# Patient Record
Sex: Female | Born: 1975 | Race: Black or African American | Hispanic: No | Marital: Single | Smoking: Former smoker
Health system: Southern US, Community
[De-identification: ages and names within clinical notes are randomized; demographics above are authoritative.]

## PROBLEM LIST (undated history)

## (undated) DIAGNOSIS — E559 Vitamin D deficiency, unspecified: Secondary | ICD-10-CM

## (undated) DIAGNOSIS — T783XXA Angioneurotic edema, initial encounter: Secondary | ICD-10-CM

## (undated) DIAGNOSIS — K219 Gastro-esophageal reflux disease without esophagitis: Secondary | ICD-10-CM

## (undated) DIAGNOSIS — D649 Anemia, unspecified: Secondary | ICD-10-CM

## (undated) DIAGNOSIS — D219 Benign neoplasm of connective and other soft tissue, unspecified: Secondary | ICD-10-CM

## (undated) DIAGNOSIS — L509 Urticaria, unspecified: Secondary | ICD-10-CM

## (undated) DIAGNOSIS — F419 Anxiety disorder, unspecified: Secondary | ICD-10-CM

## (undated) DIAGNOSIS — F1910 Other psychoactive substance abuse, uncomplicated: Secondary | ICD-10-CM

## (undated) DIAGNOSIS — T7840XA Allergy, unspecified, initial encounter: Secondary | ICD-10-CM

## (undated) HISTORY — DX: Anxiety disorder, unspecified: F41.9

## (undated) HISTORY — DX: Vitamin D deficiency, unspecified: E55.9

## (undated) HISTORY — DX: Allergy, unspecified, initial encounter: T78.40XA

## (undated) HISTORY — DX: Other psychoactive substance abuse, uncomplicated: F19.10

## (undated) HISTORY — DX: Urticaria, unspecified: L50.9

## (undated) HISTORY — DX: Gastro-esophageal reflux disease without esophagitis: K21.9

## (undated) HISTORY — DX: Anemia, unspecified: D64.9

## (undated) HISTORY — DX: Angioneurotic edema, initial encounter: T78.3XXA

## (undated) HISTORY — DX: Benign neoplasm of connective and other soft tissue, unspecified: D21.9

---

## 2009-04-24 ENCOUNTER — Encounter: Payer: Self-pay | Admitting: Family Medicine

## 2009-04-24 ENCOUNTER — Other Ambulatory Visit: Admission: RE | Admit: 2009-04-24 | Discharge: 2009-04-24 | Payer: Self-pay | Admitting: Family Medicine

## 2009-04-24 ENCOUNTER — Ambulatory Visit: Payer: Self-pay | Admitting: Family Medicine

## 2009-04-27 ENCOUNTER — Encounter: Payer: Self-pay | Admitting: Family Medicine

## 2009-04-28 LAB — CONVERTED CEMR LAB
BUN: 10 mg/dL (ref 6–23)
CO2: 22 meq/L (ref 19–32)
Calcium: 8.7 mg/dL (ref 8.4–10.5)
Chloride: 106 meq/L (ref 96–112)
Cholesterol: 122 mg/dL (ref 0–200)
Creatinine, Ser: 0.7 mg/dL (ref 0.40–1.20)
HDL: 62 mg/dL (ref >39)
Total CHOL/HDL Ratio: 2
Triglycerides: 25 mg/dL (ref <150)

## 2009-04-30 LAB — CONVERTED CEMR LAB: Pap Smear: NORMAL

## 2009-06-25 ENCOUNTER — Ambulatory Visit: Payer: Self-pay | Admitting: Family Medicine

## 2009-06-25 DIAGNOSIS — N946 Dysmenorrhea, unspecified: Secondary | ICD-10-CM | POA: Insufficient documentation

## 2009-06-25 DIAGNOSIS — L219 Seborrheic dermatitis, unspecified: Secondary | ICD-10-CM

## 2010-06-14 ENCOUNTER — Ambulatory Visit: Payer: Self-pay | Admitting: Family Medicine

## 2010-06-14 ENCOUNTER — Other Ambulatory Visit: Admission: RE | Admit: 2010-06-14 | Discharge: 2010-06-14 | Payer: Self-pay | Admitting: Family Medicine

## 2010-06-15 LAB — CONVERTED CEMR LAB
BUN: 11 mg/dL (ref 6–23)
CO2: 25 meq/L (ref 19–32)
Creatinine, Ser: 0.69 mg/dL (ref 0.40–1.20)
Glucose, Bld: 96 mg/dL (ref 70–99)
Total Bilirubin: 0.2 mg/dL — ABNORMAL LOW (ref 0.3–1.2)
Total Protein: 6.7 g/dL (ref 6.0–8.3)

## 2010-06-16 ENCOUNTER — Encounter: Payer: Self-pay | Admitting: Family Medicine

## 2010-06-16 LAB — CONVERTED CEMR LAB
Trich, Wet Prep: NONE SEEN
Yeast Wet Prep HPF POC: NONE SEEN

## 2010-06-21 ENCOUNTER — Encounter: Admission: RE | Admit: 2010-06-21 | Discharge: 2010-06-21 | Payer: Self-pay | Admitting: Family Medicine

## 2010-06-21 LAB — CONVERTED CEMR LAB: Pap Smear: NEGATIVE

## 2010-06-23 ENCOUNTER — Ambulatory Visit: Payer: Self-pay | Admitting: Family Medicine

## 2010-06-26 ENCOUNTER — Encounter: Admission: RE | Admit: 2010-06-26 | Discharge: 2010-06-26 | Payer: Self-pay | Admitting: Family Medicine

## 2010-06-27 DIAGNOSIS — D259 Leiomyoma of uterus, unspecified: Secondary | ICD-10-CM | POA: Insufficient documentation

## 2010-07-13 ENCOUNTER — Ambulatory Visit: Payer: Self-pay | Admitting: Obstetrics & Gynecology

## 2010-07-14 ENCOUNTER — Encounter: Payer: Self-pay | Admitting: Obstetrics & Gynecology

## 2010-07-14 LAB — CONVERTED CEMR LAB
Hepatitis B Surface Ag: NEGATIVE
MCV: 96 fL (ref 78.0–100.0)
RBC: 3.77 M/uL — ABNORMAL LOW (ref 3.87–5.11)
WBC: 7.2 10*3/uL (ref 4.0–10.5)

## 2010-07-20 ENCOUNTER — Telehealth: Payer: Self-pay | Admitting: Family Medicine

## 2010-10-15 ENCOUNTER — Ambulatory Visit: Admit: 2010-10-15 | Payer: Self-pay | Admitting: Obstetrics & Gynecology

## 2010-10-17 ENCOUNTER — Encounter: Payer: Self-pay | Admitting: Family Medicine

## 2010-10-20 ENCOUNTER — Ambulatory Visit
Admission: RE | Admit: 2010-10-20 | Discharge: 2010-10-20 | Payer: Self-pay | Source: Home / Self Care | Attending: Obstetrics & Gynecology | Admitting: Obstetrics & Gynecology

## 2010-10-21 NOTE — Assessment & Plan Note (Unsigned)
NAME:  Kayla Munoz, CAVENAUGH NO.:  [id number]  MEDICAL RECORD NO.:  [id number]          PATIENT TYPE:  POB  LOCATION:  [facility] [place]         FACILITY:  [facility]  PHYSICIAN:  Elsie Lincoln, MD      DATE OF BIRTH:  [birth year]  DATE OF SERVICE:  10/20/2010                                 CLINIC NOTE  HISTORY OF PRESENT ILLNESS:  The patient is a 35 year old female who presents for followup of her fibroids and bleeding.  The patient is a lovely nulliparous female who has very large fibroids.  Her uterus is approximately 18 weeks' size.  She has multiple fibroids ranging in various sizes with one that has a large submucosal component.  The patient tried Depo-Provera and has had reasonable response after one shot.  She has gone several days and missed a week without bleeding, but she does have some spotting a lot of days, although I do not consider this a good result.  She is happy given that she is to bleed a lot.  She just entered a relationship and may be sexually active soon, and obviously daily bleeding will not be satisfactory.  We talked about the Depo Lupron with myomectomy being an option.  We also talked about fertility.  The patient is interested in speaking with a Reproductive Endocrinologist about myomectomy and future fertility.  I suggested she speak with Dr. April Manson of [facility] to answer any further questions that I am unable to.  She will make an appointment with him.  The patient would like to try one more shot at Depo-Provera and see if her bleeding is even more controlled than it is.  If it is not any better, then we will most likely proceed with Depo Lupron to shrink the fibroids and then myomectomy unless Dr. April Manson can have other therapies that would benefit the patient more.  The patient's STD panel was negative last visit.  The patient again was encouraged to quit smoking.  PHYSICAL EXAMINATION:  VITAL SIGNS:  Pulse 71, blood  pressure 107/68, weight 145, height 65 inches. GENERAL:  Well-nourished, well-developed, in no apparent distress. HEENT:  Normocephalic and atraumatic. CHEST:  Normal movement. ABDOMEN:  Soft and nontender.  No rebound. GU:  Uterus is felt on the abdominal exam. GENITALIA:  Tanner V.  Vagina pink, normal rugae, small amount of old blood brown blood.  Cervix closed.  Uterus 18 weeks size, nontender. EXTREMITIES:  No edema.  ASSESSMENT/PLAN:  A 35 year old female with large fibroids in the submucosal component. 1. Depo-Provera. 2. Referral to Reproductive Endocrinology.  Preconceptual consult. 3. Return to clinic in 12 weeks for another Depo or different     management.          ______________________________ Elsie Lincoln, MD   KL/MEDQ  D:  10/20/2010  T:  10/21/2010  Job:  [id number]

## 2010-10-26 NOTE — Progress Notes (Signed)
Summary: Gas and bloating  Phone Note Call from Patient Call back at Home Phone [phone]   Caller: Patient Call For: Nani Gasser MD Summary of Call: Having alot of gas and bloating. Told to take Align- pro-biotic but has not started it. Feels like air bubble in abdomen and is painful on left flank area. No vomiting- some nausea- when eats makes it worse. Initial call taken by: Kathlene November LPN,  July 20, 2010 8:43 AM  Follow-up for Phone Call        Start teh align. Will take a week to notice improvement in sxs. Can also start prilosec OTC once a day 20 min before first meal of day. If not better by monday then call the office.  Follow-up by: Nani Gasser MD,  July 20, 2010 9:04 AM  Additional Follow-up for Phone Call Additional follow up Details #1::        Pt notified of above instructions. KJ LPN Additional Follow-up by: Kathlene November LPN,  July 20, 2010 9:11 AM

## 2010-10-26 NOTE — Assessment & Plan Note (Signed)
Summary: CPE w/pap   Vital Signs:  Patient profile:   35 year old female Height:      63 inches Weight:      146 pounds BMI:     25.96 Pulse rate:   111 / minute BP sitting:   121 / 75  (right arm) Cuff size:   regular  Vitals Entered By: Avon Gully CMA, Duncan Dull) (June 14, 2010 3:12 PM) CC: CPE and pap, concerned because periods seem to be heavier, bloating feeling in thelower abd,concerned about fibroids   Primary Care Provider:  Nani Gasser MD  CC:  CPE and pap, concerned because periods seem to be heavier, bloating feeling in thelower abd, and concerned about fibroids.  History of Present Illness: CPE and pap, concerned because periods seem to be heavier, bloating feeling in thelower abd,concerned about fibroids. Has a pulling sensation in her lower abdomen during her last period. Felt sharp and didn't last long.  Having severe gas pain for the last week.  Intermittant constipation or diarrhea.  Occ nausea as well. Has noticed a slight vaginal discharge. No odor. D/c is clear.   Current Medications (verified): 1)  Vitamin D 1000 Unit Tabs (Cholecalciferol) .... Take One Tablet By Mouth Once A Day 2)  Iron 325 (65 Fe) Mg Tabs (Ferrous Sulfate) .... Take One Tablt By Mouth Once A Day 3)  Multivitamins  Tabs (Multiple Vitamin) .... Take One Tablet By Mouth Once A Day 4)  Ibuprofen 800 Mg Tabs (Ibuprofen) .... Take 1 Tablet By Mouth Three Times A Day As Needed With Food. 5)  Triamcinolone Acetonide 0.025 % Crea (Triamcinolone Acetonide) .... Apply Once A Day As Needed  Allergies (verified): No Known Drug Allergies  Comments:  Nurse/Medical Assistant: The patient's medications and allergies were reviewed with the patient and were updated in the Medication and Allergy Lists. Avon Gully CMA, Duncan Dull) (June 14, 2010 3:14 PM)  Past History:  Past Medical History: Last updated: 04/24/2009 None  Past Surgical History: Last updated:  04/24/2009 None  Family History: Last updated: 04/24/2009 GF prostate Ca Uncle with stomach ca mother, Father with DM Father with hi chol, HTN MOther wtih pulmonary heart problem.    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  Lives alone.  Smokes 1/2 ppd for 11 years.  Drinks alsochol about 3 per week.  Drug use-no Regular exercise-yes, cardio & weights.   Social History: Reviewed history from 04/24/2009 and no changes required. 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  Lives alone.  Smokes 1/2 ppd for 11 years.  Drinks alsochol about 3 per week.  Drug use-no Regular exercise-yes, cardio & weights.   Review of Systems       The patient complains of abdominal pain.  The patient denies anorexia, fever, weight loss, weight gain, vision loss, decreased hearing, hoarseness, chest pain, syncope, dyspnea on exertion, peripheral edema, prolonged cough, headaches, hemoptysis, melena, hematochezia, severe indigestion/heartburn, hematuria, incontinence, genital sores, muscle weakness, suspicious skin lesions, transient blindness, difficulty walking, depression, unusual weight change, abnormal bleeding, enlarged lymph nodes, and breast masses.    Physical Exam  General:  Well-developed,well-nourished,in no acute distress; alert,appropriate and cooperative throughout examination Head:  Normocephalic and atraumatic without obvious abnormalities. No apparent alopecia or balding. Eyes:  No corneal or conjunctival inflammation noted. EOMI. Perrla.  Ears:  External ear exam shows no significant lesions or deformities.  Otoscopic examination reveals clear canals, tympanic membranes  are intact bilaterally without bulging, retraction, inflammation or discharge. Hearing is grossly normal bilaterally. Nose:  External nasal examination shows no deformity or inflammation. Nasal mucosa are pink and moist without lesions or  exudates. Mouth:  Oral mucosa and oropharynx without lesions or exudates.  Teeth in good repair. Neck:  No deformities, masses, or tenderness noted. Lungs:  Normal respiratory effort, chest expands symmetrically. Lungs are clear to auscultation, no crackles or wheezes. Heart:  Normal rate and regular rhythm. S1 and S2 normal without gallop, murmur, click, rub or other extra sounds. Abdomen:  Bowel sounds positive,abdomen soft and non-tender without masses, organomegaly or hernias noted. Genitalia:  Normal introitus for age, no external lesions, no vaginal discharge, mucosa pink and moist, no vaginal or cervical lesions, no vaginal atrophy, no friaility or hemorrhage, normal uterus size and position, no adnexal masses or tenderness Msk:  No deformity or scoliosis noted of thoracic or lumbar spine.   Pulses:  R and L carotid,radial,dorsalis pedis and posterior tibial pulses are full and equal bilaterally Extremities:  No clubbing, cyanosis, edema, or deformity noted with normal full range of motion of all joints.   Neurologic:  No cranial nerve deficits noted. Station and gait are normal. t. Sensory, motor and coordinative functions appear intact. Skin:  no rashes.   Cervical Nodes:  No lymphadenopathy noted Psych:  Cognition and judgment appear intact. Alert and cooperative with normal attention span and concentration. No apparent delusions, illusions, hallucinations   Impression & Recommendations:  Problem # 1:  ROUTINE GYNECOLOGICAL EXAMINATION (ICD-V72.31) Exam is normal today Due for CMP. Lipids last year were at goal.  She has been working on cutting back to 1/2ppd but she is not ready to completley quit.    Orders: T-Comprehensive Metabolic Panel ([id number])  Problem # 2:  VAGINITIS (ICD-616.10) Likely yeast based onexam. will send wet prep.   Orders: T-Wet Prep for Christoper Allegra, [employer] [phone])  Complete Medication List: 1)  Vitamin D 1000 Unit Tabs (Cholecalciferol)  .... Take one tablet by mouth once a day 2)  Iron 325 (65 Fe) Mg Tabs (Ferrous sulfate) .... Take one tablt by mouth once a day 3)  Multivitamins Tabs (Multiple vitamin) .... Take one tablet by mouth once a day 4)  Ibuprofen 800 Mg Tabs (Ibuprofen) .... Take 1 tablet by mouth three times a day as needed with food. 5)  Triamcinolone Acetonide 0.025 % Crea (Triamcinolone acetonide) .... Apply once a day as needed  Other Orders: T-*Unlisted Diagnostic X-ray test/procedure (29562)  Contraindications/Deferment of Procedures/Staging:    Test/Procedure: FLU VAX    Reason for deferment: patient declined     Classification: Smoking Cessation    Class/Stage: contemplative   Patient Instructions: 1)  Tobacco is very bad for your health and your loved ones ! You should stop smoking !  2)  It is important that you exercise reguarly at least 20 minutes 5 times a week. If you develop chest pain, have severe difficulty breathing, or feel very tired, stop exercising immediately and seek medical attention.  3)  Take calcium +vitamin D daily.

## 2010-10-26 NOTE — Assessment & Plan Note (Signed)
Summary: Tx for STD  Nurse Visit   Allergies: No Known Drug Allergies  Medication Administration  Injection # 1:    Medication: Rocephin  250mg     Diagnosis: ROUTINE GYNECOLOGICAL EXAMINATION (ICD-V72.31)    Route: IM    Site: RUOQ gluteus    Exp Date: 01/24/2013    Lot #: [id number]    Comments: time given 9:34. checked pt at 9:50     Patient tolerated injection without complications    Given by: Avon Gully CMA, Duncan Dull) (June 23, 2010 9:35 AM)  Medication # 3:    Medication: Azithromycin oral    Diagnosis: ROUTINE GYNECOLOGICAL EXAMINATION (ICD-V72.31)    Dose: 1 gram    Route: po    Exp Date: 08/26/2010    Lot #: [id number]    Patient tolerated medication without complications    Given by: Avon Gully CMA, Duncan Dull) (June 23, 2010 10:00 AM)  Orders Added: 1)  Rocephin  250mg  [J0696] 2)  Admin of Therapeutic Inj (IM or [place]) [65784] 3)  Azithromycin oral [Q0144]

## 2011-01-12 ENCOUNTER — Ambulatory Visit: Payer: BC Managed Care – PPO | Admitting: Obstetrics & Gynecology

## 2011-01-12 DIAGNOSIS — D259 Leiomyoma of uterus, unspecified: Secondary | ICD-10-CM

## 2011-01-12 DIAGNOSIS — Z01812 Encounter for preprocedural laboratory examination: Secondary | ICD-10-CM

## 2011-01-13 NOTE — Assessment & Plan Note (Signed)
Kayla Munoz, KEPPLE NO.:  [id number]  MEDICAL RECORD NO.:  [id number]           PATIENT TYPE:  LOCATION:  [facility] [place]           FACILITY:  PHYSICIAN:  Allie Bossier, MD        DATE OF BIRTH:  22-Jan-1976  DATE OF SERVICE:  01/12/2011                                 CLINIC NOTE  [name] is a 36 year old single black gravida 0 who is here for followup of her fibroids.  Dr. Penne Lash has given her two Depo-Provera injections.  The patient says that her bleeding is scant.  In the last month, her bleeding has become somewhat more of an issue but it is still very minimal bleeding.  She is interested in fertility/myomectomy.  She "forgot" to go see Dr. April Manson as Dr. Penne Lash had recommended, so I am going to reschedule her Dr. April Manson visit.  We talked about Depo- Lupron, but I would suggest we do the Depo-Provera again today to maintain her no low bleeding profile in the interim until she sees Dr. April Manson.  She is in agreement with this plan.     Allie Bossier, MD    MCD/MEDQ  D:  01/12/2011  T:  01/12/2011  Job:  [id number]

## 2011-02-08 NOTE — Assessment & Plan Note (Signed)
NAME:  CARNELIA, OSCAR NO.:  [id number]   MEDICAL RECORD NO.:  [id number]          PATIENT TYPE:  POB   LOCATION:  [facility] [place]         FACILITY:  [facility]   PHYSICIAN:  Elsie Lincoln, MD      DATE OF BIRTH:  1976/04/13   DATE OF SERVICE:  07/13/2010                                  CLINIC NOTE   The patient is a 35 year old, G0, P0 female who presents for  consultation of fibroids.  The patient states that she has minimal  cramping with her periods.  She does have heavy flow on the first day.  She has a history of anemia and has been on iron for many years.  She  states that this is not overwhelmingly horrible.  She is mostly  concerned about her fertility with the multiple fibroids.  She does have  multiple fibroids both on ultrasound and MRI.  The uterus measures 17.5  x 9.9 x 13.3 cm for estimated volume of 1189 mL, they ranged in size  from 1 cm to 9.2 cm.  The largest central uterine fibroid has a broad-  based submucosal component displacing the uterine cavity in the left.  Both ovaries are normal.  Also of note, the patient was recently treated  for gonorrhea and she needs the test of cure today.  She also needs a  wet prep for Trichomonas and she also needs all bloodborne sexually  transmitted infections testing done today.  We talked about that there  is no reason to do the hysterectomy at all at this point and myomectomy  would be rather an aggressive given that she is not in any type of  relationship to even see if she has a problem becoming pregnant.  She is  not having that many symptoms from her fibroids.  I would like to see if  she is anemic, so we are going to draw a CBC or even try her on Depo-  Provera to see if this controls her bleeding.  She has been on Ortho  Evra in the past which has minimally controlled her bleeding.  The  patient seems amenable to this level of medical management.   PAST MEDICAL HISTORY:  Anemia.   PAST SURGICAL  HISTORY:  None.   OBSTETRICAL HISTORY:  Nulliparous.   GYNECOLOGIC HISTORY:  Regular periods lasting 5-7 days.  Her cycles are  28 days with mild pain and heavy flow on the first day.  No bleeding  between periods.  She is not in a relationship right now.  She has never  had abnormal Pap smear.  She did have gonorrhea 2 weeks ago and was  treated with Rocephin and she also was given Zithromax.  She does have a  history of fibroids as described above.  She is employed as Insurance claims handler.  She does smoke half pack per day for 12 years.  She does  drink alcohol one drink per day.  She has used marijuana.  She has never  been sexually or physically abused.   MEDICATIONS:  Vitamin D Fe 325, multivitamin, and triamcinolone cream.   FAMILY HISTORY:  Positive for diabetes, heart disease, and high blood  pressure.   SYSTEMIC REVIEW:  Positive for leg cramps, abdominal pain, vaginal  discharge, or vaginal itching which resolves after the gonorrhea was  treated.   PHYSICAL EXAMINATION:  VITAL SIGNS:  Pulse 79, blood pressure 113/71,  weight 147, height 63 inches.  GENERAL:  Well-nourished, well-developed, in no apparent distress.  HEENT:  Normocephalic, atraumatic.  Good dentition.  ABDOMEN:  Soft, nontender, can feel uterus up to about approximately 18  weeks' size.  GENITALIA:  Tanner V.  Vagina pink, normal rugae with thick white  discharge.  Cervix closed, nontender.  Cervix does feel small.  Uterus  is enlarged 18 weeks' size.  There is no large posterior or anterior  component to the fibroids.   ASSESSMENT AND PLAN:  A 35 year old nulliparous female with large  fibroids.  1. CBC to see how anemic she is.  2. STD testing for recent gonorrhea.  3. UPT test before the Depo-Provera.  If this does not work, we can      consider Depo-Lupron with myomectomy if the patient desires and      symptomatic.  4. Pap smear was negative at Dr. Shelah Lewandowsky office.  5. Return to clinic in 12  weeks.           ______________________________  Elsie Lincoln, MD     KL/MEDQ  D:  07/13/2010  T:  07/14/2010  Job:  [id number]

## 2011-04-22 ENCOUNTER — Encounter: Payer: Self-pay | Admitting: Emergency Medicine

## 2011-04-22 ENCOUNTER — Ambulatory Visit (INDEPENDENT_AMBULATORY_CARE_PROVIDER_SITE_OTHER): Payer: BC Managed Care – PPO | Admitting: Emergency Medicine

## 2011-04-22 DIAGNOSIS — N949 Unspecified condition associated with female genital organs and menstrual cycle: Secondary | ICD-10-CM

## 2011-04-22 DIAGNOSIS — N938 Other specified abnormal uterine and vaginal bleeding: Secondary | ICD-10-CM

## 2011-04-22 MED ORDER — MEDROXYPROGESTERONE ACETATE 150 MG/ML IM SUSP
150.0000 mg | Freq: Once | INTRAMUSCULAR | Status: AC
  Administered 2011-04-22: 150 mg via INTRAMUSCULAR

## 2011-04-27 HISTORY — PX: UTERINE FIBROID SURGERY: SHX826

## 2011-11-28 ENCOUNTER — Other Ambulatory Visit: Payer: Self-pay | Admitting: Family Medicine

## 2011-11-28 ENCOUNTER — Encounter: Payer: Self-pay | Admitting: Family Medicine

## 2011-11-28 ENCOUNTER — Ambulatory Visit (INDEPENDENT_AMBULATORY_CARE_PROVIDER_SITE_OTHER): Payer: BC Managed Care – PPO | Admitting: Family Medicine

## 2011-11-28 VITALS — BP 127/72 | HR 92 | Ht 63.0 in | Wt 155.0 lb

## 2011-11-28 DIAGNOSIS — D239 Other benign neoplasm of skin, unspecified: Secondary | ICD-10-CM

## 2011-11-28 DIAGNOSIS — D229 Melanocytic nevi, unspecified: Secondary | ICD-10-CM

## 2011-11-28 NOTE — Progress Notes (Signed)
  Subjective:    Patient ID: Kayla Munoz, female    DOB: 10/20/1975, 36 y.o.   MRN: [id number]  HPI Hx of cradle cap and eczema.  Started on a dry patch on the left posterior leg.  Has been growing over the last 3-4 weeks. Mildy ithcy.  No trauma or bleeding oozing.     Review of Systems     Objective:   Physical Exam   Left calf 1.2 x 0.8 cm hyperpigmented nodule on this area.  Lookd dry and cracking on the surface.      Assessment & Plan:   Atypical Nevus - Will send for path. Shave bx performed.  Shave Biopsy Procedure Note  Pre-operative Diagnosis: atypical nevus  Post-operative Diagnosis: Pending.   Locations:right  calf  Indications: much larger in 3 weeks  Anesthesia: Lidocaine 1% without epinephrine without added sodium bicarbonate  Procedure Details  History of allergy to iodine: no  Patient informed of the risks (including bleeding and infection) and benefits of the  procedure and Verbal informed consent obtained.  The lesion and surrounding area were given a sterile prep using betadyne and draped in the usual sterile fashion. A scalpel was used to shave an area of skin approximately 1.2cm by 0.8 cm.  Hemostasis achieved with alumuninum chloride. Antibiotic ointment and a sterile dressing applied.  The specimen was sent for pathologic examination. The patient tolerated the procedure well.  EBL: 0 ml  Findings: Pending.   Condition: Stable  Complications: none.  Plan: 1. Instructed to keep the wound dry and covered for 24-48h and clean thereafter. 2. Warning signs of infection were reviewed.   3. Recommended that the patient use OTC acetaminophen as needed for pain.  4. Return Prn .

## 2011-11-28 NOTE — Patient Instructions (Signed)
Call if any signs of infection.  Keep covered with vaseline and bandage for one week. Wash with antibacterial soap in the shower. Pat dry.

## 2011-12-01 ENCOUNTER — Other Ambulatory Visit: Payer: Self-pay | Admitting: Family Medicine

## 2011-12-01 MED ORDER — TRIAMCINOLONE ACETONIDE 0.025 % EX OINT: TOPICAL_OINTMENT | Freq: Two times a day (BID) | CUTANEOUS | Status: DC

## 2012-01-03 ENCOUNTER — Ambulatory Visit: Payer: BC Managed Care – PPO | Admitting: Obstetrics & Gynecology

## 2012-02-14 ENCOUNTER — Telehealth: Payer: Self-pay | Admitting: *Deleted

## 2012-02-14 NOTE — Telephone Encounter (Signed)
Pt called stating that this is the 2nd period since going off Provera and having surgery for her fibroids.  She states that she is bleeding through a maxi and super tampon about every 1 1/2 hrs.  Explained that after stopping Depo cycles can be somewhat irregular for the first few and to continue to monitor this cycle.  If bleeding that heavy and having to change pads every 15 minutes to either call office or go to [facility].  Pt does have a history of anemia and strongly suggested if she is not taking a MVI to start one and if she is to start a FE supplement every other day.

## 2013-07-29 ENCOUNTER — Other Ambulatory Visit (HOSPITAL_COMMUNITY)
Admission: RE | Admit: 2013-07-29 | Discharge: 2013-07-29 | Disposition: A | Discharge status: Home / Self Care | Payer: BC Managed Care – PPO | Source: Ambulatory Visit | Attending: Family Medicine | Admitting: Family Medicine

## 2013-07-29 ENCOUNTER — Ambulatory Visit (INDEPENDENT_AMBULATORY_CARE_PROVIDER_SITE_OTHER): Payer: BC Managed Care – PPO | Admitting: Family Medicine

## 2013-07-29 ENCOUNTER — Encounter: Payer: Self-pay | Admitting: Family Medicine

## 2013-07-29 VITALS — BP 124/74 | HR 88 | Ht 62.5 in | Wt 150.0 lb

## 2013-07-29 DIAGNOSIS — Z01419 Encounter for gynecological examination (general) (routine) without abnormal findings: Secondary | ICD-10-CM

## 2013-07-29 DIAGNOSIS — D259 Leiomyoma of uterus, unspecified: Secondary | ICD-10-CM

## 2013-07-29 DIAGNOSIS — Z1151 Encounter for screening for human papillomavirus (HPV): Secondary | ICD-10-CM | POA: Insufficient documentation

## 2013-07-29 DIAGNOSIS — Z23 Encounter for immunization: Secondary | ICD-10-CM

## 2013-07-29 MED ORDER — TRIAMCINOLONE ACETONIDE 0.025 % EX OINT: TOPICAL_OINTMENT | Freq: Two times a day (BID) | CUTANEOUS | Status: DC

## 2013-07-29 NOTE — Progress Notes (Signed)
  Subjective:     Kayla Munoz is a 37 y.o. female and is here for a comprehensive physical exam. The patient reports problems - fibroids - wonders if they are growing again.  Had surgery about 2 years ago.  her cycles have been shorter. Only 2 weeks between last cycle.  No more pain or cramping.  Bleeding has been heavier like before had fibroid surgery.  No abnormal d/c or pelvic pain. Does have occ cramps.    History   Social History  . Marital Status: Single    Spouse Name: N/A    Number of Children: N/A  . Years of Education: N/A   Occupational History  . Not on file.   Social History Main Topics  . Smoking status: Current Every Day Smoker -- 0.50 packs/day for 12 years    Types: Cigarettes  . Smokeless tobacco: Never Used  . Alcohol Use: 3.5 oz/week    7 drink(s) per week  . Drug Use: Yes    Special: Marijuana  . Sexual Activity:    Other Topics Concern  . Not on file   Social History Narrative  . No narrative on file   Health Maintenance  Topic Date Due  . Influenza Vaccine  04/26/2013  . Pap Smear  09/26/2013  . Tetanus/tdap  09/26/2013    The following portions of the patient's history were reviewed and updated as appropriate: allergies, current medications, past family history, past medical history, past social history, past surgical history and problem list.  Review of Systems A comprehensive review of systems was negative.   Objective:    BP 124/74  Pulse 88  Ht 5' 2.5" (1.588 m)  Wt 150 lb (68.04 kg)  BMI 26.98 kg/m2  SpO2 98%  LMP 07/16/2013 General appearance: alert, cooperative and appears stated age Head: Normocephalic, without obvious abnormality, atraumatic Eyes: conj clear, EOMi, PEERLA Ears: normal TM's and external ear canals both ears Nose: Nares normal. Septum midline. Mucosa normal. No drainage or sinus tenderness. Throat: lips, mucosa, and tongue normal; teeth and gums normal Neck: no adenopathy, no carotid bruit, no JVD, supple,  symmetrical, trachea midline and thyroid not enlarged, symmetric, no tenderness/mass/nodules Back: symmetric, no curvature. ROM normal. No CVA tenderness. Lungs: clear to auscultation bilaterally Breasts: normal appearance, no masses or tenderness Heart: regular rate and rhythm, S1, S2 normal, no murmur, click, rub or gallop Abdomen: soft, non-tender; bowel sounds normal; no masses,  no organomegaly Pelvic: cervix normal in appearance, external genitalia normal, no adnexal masses or tenderness, no cervical motion tenderness, rectovaginal septum normal, uterus normal size, shape, and consistency and vagina normal without discharge Extremities: extremities normal, atraumatic, no cyanosis or edema Pulses: 2+ and symmetric Skin: Skin color, texture, turgor normal. No rashes or lesions Lymph nodes: Cervical, supraclavicular, and axillary nodes normal. Neurologic: Grossly normal  A & O x 3.    Assessment:    Healthy female exam.      Plan:     See After Visit Summary for Counseling Recommendations   Keep up a regular exercise program and make sure you are eating a healthy diet Try to eat 4 servings of dairy a day, or if you are lactose intolerant take a calcium with vitamin D daily.  Your vaccines are up to date.  Tdap given today.  Will call w/ pap results  Fibroids - will schedule an [place] for further evaluation

## 2013-07-29 NOTE — Addendum Note (Signed)
Addended by: Chalmers Cater on: 07/29/2013 02:22 PM   Modules accepted: Orders

## 2013-07-29 NOTE — Patient Instructions (Signed)
Keep up a regular exercise program and make sure you are eating a healthy diet Try to eat 4 servings of dairy a day, or if you are lactose intolerant take a calcium with vitamin D daily.  Your vaccines are up to date.   

## 2013-08-01 LAB — LIPID PANEL
HDL: 62 mg/dL (ref >39)
LDL Cholesterol: 55 mg/dL (ref 0–99)
Total CHOL/HDL Ratio: 2 Ratio
Triglycerides: 28 mg/dL (ref <150)
VLDL: 6 mg/dL (ref 0–40)

## 2013-08-01 LAB — COMPLETE METABOLIC PANEL WITH GFR
ALT: 9 U/L (ref 0–35)
AST: 16 U/L (ref 0–37)
Alkaline Phosphatase: 39 U/L (ref 39–117)
Calcium: 9 mg/dL (ref 8.4–10.5)
Chloride: 109 mEq/L (ref 96–112)
Creat: 0.76 mg/dL (ref 0.50–1.10)
Total Bilirubin: 0.4 mg/dL (ref 0.3–1.2)

## 2013-08-06 ENCOUNTER — Ambulatory Visit (INDEPENDENT_AMBULATORY_CARE_PROVIDER_SITE_OTHER): Payer: BC Managed Care – PPO

## 2013-08-06 DIAGNOSIS — N859 Noninflammatory disorder of uterus, unspecified: Secondary | ICD-10-CM

## 2013-08-06 DIAGNOSIS — D259 Leiomyoma of uterus, unspecified: Secondary | ICD-10-CM

## 2013-08-06 DIAGNOSIS — D251 Intramural leiomyoma of uterus: Secondary | ICD-10-CM

## 2013-08-08 ENCOUNTER — Other Ambulatory Visit: Payer: Self-pay | Admitting: Family Medicine

## 2013-08-08 DIAGNOSIS — D259 Leiomyoma of uterus, unspecified: Secondary | ICD-10-CM

## 2013-09-11 ENCOUNTER — Encounter: Payer: Self-pay | Admitting: Obstetrics & Gynecology

## 2013-09-11 ENCOUNTER — Ambulatory Visit (INDEPENDENT_AMBULATORY_CARE_PROVIDER_SITE_OTHER): Payer: BC Managed Care – PPO | Admitting: Obstetrics & Gynecology

## 2013-09-11 VITALS — BP 129/80 | HR 100 | Resp 16 | Ht 63.0 in | Wt 154.0 lb

## 2013-09-11 DIAGNOSIS — D649 Anemia, unspecified: Secondary | ICD-10-CM

## 2013-09-11 DIAGNOSIS — D259 Leiomyoma of uterus, unspecified: Secondary | ICD-10-CM

## 2013-09-11 DIAGNOSIS — D219 Benign neoplasm of connective and other soft tissue, unspecified: Secondary | ICD-10-CM

## 2013-09-11 DIAGNOSIS — R6889 Other general symptoms and signs: Secondary | ICD-10-CM

## 2013-09-11 LAB — CBC
MCHC: 32.4 g/dL (ref 30.0–36.0)
Platelets: 426 10*3/uL — ABNORMAL HIGH (ref 150–400)
RDW: 16.7 % — ABNORMAL HIGH (ref 11.5–15.5)

## 2013-09-11 LAB — TSH: TSH: 0.512 u[IU]/mL (ref 0.350–4.500)

## 2013-09-11 MED ORDER — MEDROXYPROGESTERONE ACETATE 104 MG/0.65ML ~~LOC~~ SUSP
104.0000 mg | Freq: Once | SUBCUTANEOUS | Status: AC
  Administered 2013-09-11: 104 mg via SUBCUTANEOUS

## 2013-09-11 NOTE — Addendum Note (Signed)
Addended by: Granville Lewis on: 09/11/2013 09:54 AM   Modules accepted: Orders

## 2013-09-11 NOTE — Patient Instructions (Signed)
Fibroids Fibroids are lumps (tumors) that can occur any place in a woman's body. These lumps are not cancerous. Fibroids vary in size, weight, and where they grow. HOME CARE  Do not take aspirin.  Write down the number of pads or tampons you use during your period. Tell your doctor. This can help determine the best treatment for you. GET HELP RIGHT AWAY IF:  You have pain in your lower belly (abdomen) that is not helped with medicine.  You have cramps that are not helped with medicine.  You have more bleeding between or during your period.  You feel lightheaded or pass out (faint).  Your lower belly pain gets worse. MAKE SURE YOU:  Understand these instructions.  Will watch your condition.  Will get help right away if you are not doing well or get worse. Document Released: 10/15/2010 Document Revised: 12/05/2011 Document Reviewed: 10/15/2010 [employer] Patient Information 2014 [employer].  

## 2013-09-11 NOTE — Progress Notes (Addendum)
   Subjective:    Patient ID: Kayla Munoz, female    DOB: 1976/01/07, 37 y.o.   MRN: [id number]  HPI  Kayla Munoz is a 37 yo S G0 with a known h/o fibroids. She is here for the first time in 2 years to follow up on her fibroids. Dr. Linford Arnold ordered a gyn u/s. This continues to show fibroids. She did have a myomectomy with Dr. April Manson in 04/2011. Her periods are not painful but they are heavy. Her last CBC was in 2012. She is abstinent since 2012.  Review of Systems She used depo provera in the past for her menorrhagia and she didn't notice a difference in 12 weeks.    Objective:   Physical Exam        Assessment & Plan:  Fibroids, menorrhagia- I will recheck her CBC. She will get a depo provera today. RTC 3 months

## 2013-10-04 ENCOUNTER — Telehealth: Payer: Self-pay | Admitting: *Deleted

## 2013-10-04 NOTE — Telephone Encounter (Signed)
Pt lvm asking for an appt w/dr.metheney with regards to smoking cessation. She would like to come in and see her for this. I called pt back and she does not have vm.Kayla Munoz [place]

## 2013-11-27 ENCOUNTER — Ambulatory Visit (INDEPENDENT_AMBULATORY_CARE_PROVIDER_SITE_OTHER): Payer: BC Managed Care – PPO | Admitting: *Deleted

## 2013-11-27 DIAGNOSIS — Z3042 Encounter for surveillance of injectable contraceptive: Secondary | ICD-10-CM

## 2013-11-27 DIAGNOSIS — Z3049 Encounter for surveillance of other contraceptives: Secondary | ICD-10-CM

## 2013-11-27 MED ORDER — MEDROXYPROGESTERONE ACETATE 104 MG/0.65ML ~~LOC~~ SUSP
104.0000 mg | Freq: Once | SUBCUTANEOUS | Status: AC
  Administered 2013-11-27: 104 mg via SUBCUTANEOUS

## 2014-02-19 ENCOUNTER — Ambulatory Visit (INDEPENDENT_AMBULATORY_CARE_PROVIDER_SITE_OTHER): Payer: BC Managed Care – PPO | Admitting: *Deleted

## 2014-02-19 VITALS — BP 135/85 | HR 112 | Resp 16 | Wt 145.0 lb

## 2014-02-19 DIAGNOSIS — IMO0001 Reserved for inherently not codable concepts without codable children: Secondary | ICD-10-CM

## 2014-02-19 DIAGNOSIS — Z3049 Encounter for surveillance of other contraceptives: Secondary | ICD-10-CM

## 2014-02-19 MED ORDER — MEDROXYPROGESTERONE ACETATE 150 MG/ML IM SUSP
150.0000 mg | INTRAMUSCULAR | Status: AC
  Administered 2014-02-19 – 2014-05-20 (×2): 150 mg via INTRAMUSCULAR

## 2014-04-29 ENCOUNTER — Encounter: Payer: Self-pay | Admitting: Obstetrics & Gynecology

## 2014-04-29 ENCOUNTER — Ambulatory Visit (INDEPENDENT_AMBULATORY_CARE_PROVIDER_SITE_OTHER): Payer: BC Managed Care – PPO | Admitting: Obstetrics & Gynecology

## 2014-04-29 VITALS — Ht 63.75 in | Wt 146.0 lb

## 2014-04-29 DIAGNOSIS — D259 Leiomyoma of uterus, unspecified: Secondary | ICD-10-CM | POA: Diagnosis not present

## 2014-04-29 NOTE — Progress Notes (Signed)
   Subjective:    Patient ID: Kayla Munoz, female    DOB: 06-20-1976, 38 y.o.   MRN: [id number]  HPI  38 yo female presents for follow up for fibroids and depo.  Bleeding much improved on depo provera.  She spots occasionally but overall much better.  She thinks her fibroids are growoing.  No increased pain but some cramping.  Pt still would like to become pregnant.  There is no a man in her life but she is actively looking.     Review of Systems  Spotting on depo and some cramping.      Objective:   Physical Exam  Vitals reviewed. Constitutional: She appears well-developed and well-nourished. No distress.  HENT:  Head: Normocephalic and atraumatic.  Eyes: Conjunctivae are normal.  Pulmonary/Chest: Effort normal.  Abdominal: Soft.  Genitourinary:  Enlarged uterus--approx 14-16 week size.  No adnexal masses.  Non tender.     Filed Vitals:   04/29/14 1145  Height: 5' 3.75" (1.619 m)  Weight: 146 lb (66.225 kg)           Assessment & Plan:  38 yo G0P0 with fibroid uterus s/p myomectomy with recurrence of fibroids  1-Continue Depo 2-TV US to evaluate fibroids.

## 2014-04-30 ENCOUNTER — Encounter: Payer: Self-pay | Admitting: Obstetrics & Gynecology

## 2014-04-30 ENCOUNTER — Ambulatory Visit: Payer: BC Managed Care – PPO | Admitting: Obstetrics & Gynecology

## 2014-05-12 ENCOUNTER — Ambulatory Visit (INDEPENDENT_AMBULATORY_CARE_PROVIDER_SITE_OTHER): Payer: BC Managed Care – PPO

## 2014-05-12 DIAGNOSIS — D259 Leiomyoma of uterus, unspecified: Secondary | ICD-10-CM

## 2014-05-19 ENCOUNTER — Ambulatory Visit: Payer: BC Managed Care – PPO

## 2014-05-20 ENCOUNTER — Ambulatory Visit (INDEPENDENT_AMBULATORY_CARE_PROVIDER_SITE_OTHER): Payer: BC Managed Care – PPO | Admitting: Obstetrics & Gynecology

## 2014-05-20 ENCOUNTER — Encounter: Payer: Self-pay | Admitting: Obstetrics & Gynecology

## 2014-05-20 VITALS — BP 131/79 | HR 82 | Resp 16 | Ht 63.0 in | Wt 145.0 lb

## 2014-05-20 DIAGNOSIS — Z3049 Encounter for surveillance of other contraceptives: Secondary | ICD-10-CM

## 2014-05-20 DIAGNOSIS — D259 Leiomyoma of uterus, unspecified: Secondary | ICD-10-CM | POA: Diagnosis not present

## 2014-05-20 MED ORDER — CALCIUM CARBONATE-VITAMIN D 500-200 MG-UNIT PO TABS: 1.0000 | ORAL_TABLET | Freq: Two times a day (BID) | ORAL | Status: DC

## 2014-05-20 NOTE — Progress Notes (Signed)
Pt here for f/u of [place] and to talk about depo.  Pt's [place] stable for fibroids.  Endometrium is 21 mm which is thick for depo provera, however upon review of her images and past MRI, endometrium is difficult to measure sue to submucosal component of fibroid.  Pt has a follicle on her right ovary.  No cyst per consensus guidelines.  Pt would like to continue on depo.  Pt is to start calcium and vit D supplementation.    Quit Smoking!!

## 2014-05-27 ENCOUNTER — Telehealth: Payer: Self-pay | Admitting: Family Medicine

## 2014-05-27 ENCOUNTER — Ambulatory Visit: Payer: BC Managed Care – PPO | Admitting: Family Medicine

## 2014-05-27 NOTE — Telephone Encounter (Signed)
Patient has been deemed a "no-show" for today's scheduled appointment.  Based on chief complaint and my chart review: -No follow-up necessary.

## 2014-06-03 ENCOUNTER — Ambulatory Visit (INDEPENDENT_AMBULATORY_CARE_PROVIDER_SITE_OTHER): Payer: BC Managed Care – PPO | Admitting: Family Medicine

## 2014-06-03 ENCOUNTER — Encounter: Payer: Self-pay | Admitting: Family Medicine

## 2014-06-03 VITALS — BP 117/74 | HR 77 | Wt 150.0 lb

## 2014-06-03 DIAGNOSIS — L219 Seborrheic dermatitis, unspecified: Secondary | ICD-10-CM

## 2014-06-03 DIAGNOSIS — Z720 Tobacco use: Secondary | ICD-10-CM

## 2014-06-03 DIAGNOSIS — F17200 Nicotine dependence, unspecified, uncomplicated: Secondary | ICD-10-CM | POA: Diagnosis not present

## 2014-06-03 MED ORDER — KETOCONAZOLE 2 % EX SHAM: MEDICATED_SHAMPOO | CUTANEOUS | Status: DC

## 2014-06-03 NOTE — Progress Notes (Signed)
   Subjective:    Patient ID: Kayla Munoz, female    DOB: October 01, 1975, 38 y.o.   MRN: [id number]  Nicotine Dependence Her urge triggers include company of smokers.   Felt a lump on her gum and went to her dentist.  Did see an oral surgeon who felt that is not worriesome. She is thinking about having CT done. She wants to quit smoking.  She is not interested in Chantix.  She quit smoking 2 weeks ago.  Has been smoking for 15 years.  Has stopped in the past for 1 week.   She is in graduate school and so uses smoking as a stress reliever and socially and sometimes when she's bored. She feels like eating meals is a trigger for her.  Review of Systems     Objective:   Physical Exam  Constitutional: She is oriented to person, place, and time. She appears well-developed and well-nourished.  HENT:  Head: Normocephalic and atraumatic.  Cardiovascular: Normal rate, regular rhythm and normal heart sounds.   Pulmonary/Chest: Effort normal and breath sounds normal.  Neurological: She is alert and oriented to person, place, and time.  Skin: Skin is warm and dry.  Psychiatric: She has a normal mood and affect. Her behavior is normal.          Assessment & Plan:  Tob abuse - Discussed treatment options including medication. I woundn't recommend a nicotine replacement since she quit 2 weeks ago.  Discussed pros and cons and potential S.E and benefits also recommend 1800-QUIT -NOW.    Time spent 20 min, >50% spent counseling about need for smoking cessation.   Dermatitis-she would like a refill on her cheek on file shampoo. She feels like her symptoms have been flaring recently.

## 2014-06-03 NOTE — Patient Instructions (Signed)
Bupropion tablets (Depression/Mood Disorders) What is this medicine? BUPROPION (byoo PROE pee on) is used to treat depression. This medicine may be used for other purposes; ask your health care provider or pharmacist if you have questions. COMMON BRAND NAME(S): Wellbutrin What should I tell my health care provider before I take this medicine? They need to know if you have any of these conditions: -an eating disorder, such as anorexia or bulimia -bipolar disorder or psychosis -diabetes or high blood sugar, treated with medication -glaucoma -heart disease, previous heart attack, or irregular heart beat -head injury or brain tumor -high blood pressure -kidney or liver disease -seizures -suicidal thoughts or a previous suicide attempt -Tourette's syndrome -weight loss -an unusual or allergic reaction to bupropion, other medicines, foods, dyes, or preservatives -breast-feeding -pregnant or trying to become pregnant How should I use this medicine? Take this medicine by mouth with a glass of water. Follow the directions on the prescription label. You can take it with or without food. If it upsets your stomach, take it with food. Take your medicine at regular intervals. Do not take your medicine more often than directed. Do not stop taking this medicine suddenly except upon the advice of your doctor. Stopping this medicine too quickly may cause serious side effects or your condition may worsen. A special MedGuide will be given to you by the pharmacist with each prescription and refill. Be sure to read this information carefully each time. Talk to your pediatrician regarding the use of this medicine in children. Special care may be needed. Overdosage: If you think you have taken too much of this medicine contact a poison control center or emergency room at once. NOTE: This medicine is only for you. Do not share this medicine with others. What if I miss a dose? If you miss a dose, take it as soon  as you can. If it is less than four hours to your next dose, take only that dose and skip the missed dose. Do not take double or extra doses. What may interact with this medicine? Do not take this medicine with any of the following medications: -linezolid -MAOIs like Azilect, Carbex, Eldepryl, Marplan, Nardil, and Parnate -methylene blue (injected into a vein) -other medicines that contain bupropion like Zyban This medicine may also interact with the following medications: -alcohol -certain medicines for anxiety or sleep -certain medicines for blood pressure like metoprolol, propranolol -certain medicines for depression or psychotic disturbances -certain medicines for HIV or AIDS like efavirenz, lopinavir, nelfinavir, ritonavir -certain medicines for irregular heart beat like propafenone, flecainide -certain medicines for Parkinson's disease like amantadine, levodopa -certain medicines for seizures like carbamazepine, phenytoin, phenobarbital -cimetidine -clopidogrel -cyclophosphamide -furazolidone -isoniazid -nicotine -orphenadrine -procarbazine -steroid medicines like prednisone or cortisone -stimulant medicines for attention disorders, weight loss, or to stay awake -tamoxifen -theophylline -thiotepa -ticlopidine -tramadol -warfarin This list may not describe all possible interactions. Give your health care provider a list of all the medicines, herbs, non-prescription drugs, or dietary supplements you use. Also tell them if you smoke, drink alcohol, or use illegal drugs. Some items may interact with your medicine. What should I watch for while using this medicine? Tell your doctor if your symptoms do not get better or if they get worse. Visit your doctor or health care professional for regular checks on your progress. Because it may take several weeks to see the full effects of this medicine, it is important to continue your treatment as prescribed by your doctor. Patients and  their families should   watch out for new or worsening thoughts of suicide or depression. Also watch out for sudden changes in feelings such as feeling anxious, agitated, panicky, irritable, hostile, aggressive, impulsive, severely restless, overly excited and hyperactive, or not being able to sleep. If this happens, especially at the beginning of treatment or after a change in dose, call your health care professional. Avoid alcoholic drinks while taking this medicine. Drinking excessive alcoholic beverages, using sleeping or anxiety medicines, or quickly stopping the use of these agents while taking this medicine may increase your risk for a seizure. Do not drive or use heavy machinery until you know how this medicine affects you. This medicine can impair your ability to perform these tasks. Do not take this medicine close to bedtime. It may prevent you from sleeping. Your mouth may get dry. Chewing sugarless gum or sucking hard candy, and drinking plenty of water may help. Contact your doctor if the problem does not go away or is severe. What side effects may I notice from receiving this medicine? Side effects that you should report to your doctor or health care professional as soon as possible: -allergic reactions like skin rash, itching or hives, swelling of the face, lips, or tongue -breathing problems -changes in vision -confusion -fast or irregular heartbeat -hallucinations -increased blood pressure -redness, blistering, peeling or loosening of the skin, including inside the mouth -seizures -suicidal thoughts or other mood changes -unusually weak or tired -vomiting Side effects that usually do not require medical attention (report to your doctor or health care professional if they continue or are bothersome): -change in sex drive or performance -constipation -headache -loss of appetite -nausea -tremors -weight loss This list may not describe all possible side effects. Call your doctor  for medical advice about side effects. You may report side effects to FDA at 1-800-FDA-1088. Where should I keep my medicine? Keep out of the reach of children. Store at room temperature between 15 and 25 degrees C (59 and 77 degrees F), away from direct sunlight and moisture. Keep tightly closed. Throw away any unused medicine after the expiration date. NOTE: This sheet is a summary. It may not cover all possible information. If you have questions about this medicine, talk to your doctor, pharmacist, or health care provider.  2015, Elsevier/Gold Standard. (2013-04-05 12:42:42)  

## 2014-07-28 ENCOUNTER — Ambulatory Visit (INDEPENDENT_AMBULATORY_CARE_PROVIDER_SITE_OTHER): Payer: BC Managed Care – PPO | Admitting: Family Medicine

## 2014-07-28 VITALS — BP 125/73 | HR 95 | Temp 98.3°F | Ht 63.0 in | Wt 158.0 lb

## 2014-07-28 DIAGNOSIS — R300 Dysuria: Secondary | ICD-10-CM

## 2014-07-28 DIAGNOSIS — R319 Hematuria, unspecified: Secondary | ICD-10-CM

## 2014-07-28 LAB — POCT URINALYSIS DIPSTICK
BILIRUBIN UA: NEGATIVE
GLUCOSE UA: NEGATIVE
KETONES UA: NEGATIVE
Nitrite, UA: NEGATIVE
SPEC GRAV UA: 1.025
UROBILINOGEN UA: 0.2
pH, UA: 6

## 2014-07-28 MED ORDER — SULFAMETHOXAZOLE-TRIMETHOPRIM 800-160 MG PO TABS: 1.0000 | ORAL_TABLET | Freq: Two times a day (BID) | ORAL | Status: DC

## 2014-07-28 NOTE — Progress Notes (Signed)
  SKA:[email or web address],IOMBTDHRC, MD Chief Complaint  Patient presents with  . Dysuria  . Urinary Frequency    Current Issues:  Presents with 14 days of dysuria and urinary frequency Associated symptoms include:  no Fever, chills or back pain  There is a previous history of of similar symptoms. Sexually active:  Yes with female.   No concern for STI.  Prior to Admission medications   Medication Sig Start Date End Date Taking? Authorizing Provider  calcium-vitamin D (OSCAL WITH D) 500-200 MG-UNIT per tablet Take 1 tablet by mouth 2 (two) times daily. 05/20/14   Guss Bunde, MD  ketoconazole (NIZORAL) 2 % shampoo Wash affected areas once a day 06/03/14   [name], MD    Review of Systems:neg  PE:  BP 125/73 mmHg  Pulse 95  Temp(Src) 98.3 F (36.8 C) (Oral)  Ht 5\' 3"  (1.6 m)  Wt 158 lb (71.668 kg)  BMI 28.00 kg/m2 Constitutional: NAD   Results for orders placed or performed in visit on 07/28/14  POCT urinalysis dipstick  Result Value Ref Range   Color, UA yellow    Clarity, UA cloudy    Glucose, UA neg    Bilirubin, UA neg    Ketones, UA neg    Spec Grav, UA 1.025    Blood, UA moderate    pH, UA 6.0    Protein, UA trace    Urobilinogen, UA 0.2    Nitrite, UA neg    Leukocytes, UA large (3+)     Assessment and Plan:  1. Dysuria UTI - POCT urinalysis dipstick - Urine Culture - will tx with bactrim DS x 3 days.

## 2014-07-28 NOTE — Progress Notes (Signed)
   Subjective:    Patient ID: Kayla Munoz, female    DOB: 1976-03-19, 38 y.o.   MRN: [id number] Pt in for a nurse visit to check UA.  Denies any fever, chills, or sweats. Beatris Ship, CMA HPI    Review of Systems     Objective:   Physical Exam        Assessment & Plan:

## 2014-07-31 LAB — URINE CULTURE

## 2014-11-04 ENCOUNTER — Other Ambulatory Visit (INDEPENDENT_AMBULATORY_CARE_PROVIDER_SITE_OTHER): Payer: BLUE CROSS/BLUE SHIELD

## 2014-11-04 DIAGNOSIS — Z3202 Encounter for pregnancy test, result negative: Secondary | ICD-10-CM | POA: Diagnosis not present

## 2014-11-04 DIAGNOSIS — Z3040 Encounter for surveillance of contraceptives, unspecified: Secondary | ICD-10-CM

## 2014-11-04 LAB — POCT URINE PREGNANCY: PREG TEST UR: NEGATIVE

## 2014-11-18 ENCOUNTER — Ambulatory Visit (INDEPENDENT_AMBULATORY_CARE_PROVIDER_SITE_OTHER): Payer: BLUE CROSS/BLUE SHIELD | Admitting: *Deleted

## 2014-11-18 VITALS — BP 124/79 | HR 68

## 2014-11-18 DIAGNOSIS — Z3202 Encounter for pregnancy test, result negative: Secondary | ICD-10-CM

## 2014-11-18 DIAGNOSIS — Z3042 Encounter for surveillance of injectable contraceptive: Secondary | ICD-10-CM

## 2014-11-18 LAB — POCT URINE PREGNANCY: Preg Test, Ur: NEGATIVE

## 2014-11-18 MED ORDER — MEDROXYPROGESTERONE ACETATE 150 MG/ML IM SUSP
150.0000 mg | Freq: Once | INTRAMUSCULAR | Status: AC
  Administered 2014-11-18: 150 mg via INTRAMUSCULAR

## 2015-01-21 ENCOUNTER — Ambulatory Visit (INDEPENDENT_AMBULATORY_CARE_PROVIDER_SITE_OTHER): Payer: BC Managed Care – PPO | Admitting: Obstetrics & Gynecology

## 2015-01-21 ENCOUNTER — Encounter: Payer: Self-pay | Admitting: Obstetrics & Gynecology

## 2015-01-21 VITALS — BP 117/70 | HR 81 | Resp 16 | Ht 63.0 in | Wt 173.0 lb

## 2015-01-21 DIAGNOSIS — Z1151 Encounter for screening for human papillomavirus (HPV): Secondary | ICD-10-CM | POA: Diagnosis not present

## 2015-01-21 DIAGNOSIS — Z113 Encounter for screening for infections with a predominantly sexual mode of transmission: Secondary | ICD-10-CM

## 2015-01-21 DIAGNOSIS — Z01419 Encounter for gynecological examination (general) (routine) without abnormal findings: Secondary | ICD-10-CM | POA: Diagnosis not present

## 2015-01-21 DIAGNOSIS — Z124 Encounter for screening for malignant neoplasm of cervix: Secondary | ICD-10-CM

## 2015-01-21 DIAGNOSIS — Z0000 Encounter for general adult medical examination without abnormal findings: Secondary | ICD-10-CM

## 2015-01-21 LAB — CBC
HCT: 41.9 % (ref 36.0–46.0)
HEMOGLOBIN: 14.2 g/dL (ref 12.0–15.0)
MCH: 33.3 pg (ref 26.0–34.0)
MCHC: 33.9 g/dL (ref 30.0–36.0)
MCV: 98.4 fL (ref 78.0–100.0)
MPV: 9.5 fL (ref 8.6–12.4)
Platelets: 299 10*3/uL (ref 150–400)
RBC: 4.26 MIL/uL (ref 3.87–5.11)
RDW: 14.1 % (ref 11.5–15.5)
WBC: 6.3 10*3/uL (ref 4.0–10.5)

## 2015-01-21 LAB — COMPREHENSIVE METABOLIC PANEL
ALK PHOS: 38 U/L — AB (ref 39–117)
ALT: 13 U/L (ref 0–35)
AST: 14 U/L (ref 0–37)
Albumin: 3.8 g/dL (ref 3.5–5.2)
BUN: 11 mg/dL (ref 6–23)
CO2: 24 meq/L (ref 19–32)
Calcium: 9.2 mg/dL (ref 8.4–10.5)
Chloride: 106 mEq/L (ref 96–112)
Creat: 0.74 mg/dL (ref 0.50–1.10)
Glucose, Bld: 89 mg/dL (ref 70–99)
Potassium: 4.2 mEq/L (ref 3.5–5.3)
Sodium: 141 mEq/L (ref 135–145)
TOTAL PROTEIN: 6.7 g/dL (ref 6.0–8.3)
Total Bilirubin: 0.4 mg/dL (ref 0.2–1.2)

## 2015-01-21 LAB — LIPID PANEL
CHOL/HDL RATIO: 2.7 ratio
Cholesterol: 136 mg/dL (ref 0–200)
HDL: 50 mg/dL (ref >=46)
LDL Cholesterol: 79 mg/dL (ref 0–99)
Triglycerides: 33 mg/dL (ref <150)
VLDL: 7 mg/dL (ref 0–40)

## 2015-01-21 LAB — TSH: TSH: 1.548 u[IU]/mL (ref 0.350–4.500)

## 2015-01-21 NOTE — Progress Notes (Signed)
Subjective:    Kayla Munoz is a 39 y.o. S AA P0 female who presents for an annual exam. The patient has no complaints today. The patient is not currently sexually active for the last several months. GYN screening history: last pap: was normal. The patient wears seatbelts: yes. The patient participates in regular exercise: yes. Has the patient ever been transfused or tattooed?: no. The patient reports that there is not domestic violence in her life.   Menstrual History: OB History    Gravida Para Term Preterm AB TAB SAB Ectopic Multiple Living   0               Menarche age: 80  Patient's last menstrual period was 01/07/2015.    The following portions of the patient's history were reviewed and updated as appropriate: allergies, current medications, past family history, past medical history, past social history, past surgical history and problem list.  Review of Systems Pertinent items are noted in HPI. Works at [employer]) and taking classes at [employer]. No FH of breast, gyn, colon cancer. Periods irregular spotting with depo provera. She quit smoking almost a year ago. Denies dyspareunia.  Objective:    BP 117/70 mmHg  Pulse 81  Resp 16  Ht 5\' 3"  (1.6 m)  Wt 173 lb (78.472 kg)  BMI 30.65 kg/m2  LMP 01/07/2015  General Appearance:    Alert, cooperative, no distress, appears stated age  Head:    Normocephalic, without obvious abnormality, atraumatic  Eyes:    PERRL, conjunctiva/corneas clear, EOM's intact, fundi    benign, both eyes  Ears:    Normal TM's and external ear canals, both ears  Nose:   Nares normal, septum midline, mucosa normal, no drainage    or sinus tenderness  Throat:   Lips, mucosa, and tongue normal; teeth and gums normal  Neck:   Supple, symmetrical, trachea midline, no adenopathy;    thyroid:  no enlargement/tenderness/nodules; no carotid   bruit or JVD  Back:     Symmetric, no curvature, ROM normal, no CVA tenderness  Lungs:     Clear to auscultation  bilaterally, respirations unlabored  Chest Wall:    No tenderness or deformity   Heart:    Regular rate and rhythm, S1 and S2 normal, no murmur, rub   or gallop  Breast Exam:    No tenderness, masses, or nipple abnormality  Abdomen:     Soft, non-tender, bowel sounds active all four quadrants,    no masses, no organomegaly  Genitalia:    Normal female without lesion, discharge or tenderness, 8-10 week size c/w fibroids, mobile, NT, no adnexal masses     Extremities:   Extremities normal, atraumatic, no cyanosis or edema  Pulses:   2+ and symmetric all extremities  Skin:   Skin color, texture, turgor normal, no rashes or lesions  Lymph nodes:   Cervical, supraclavicular, and axillary nodes normal  Neurologic:   CNII-XII intact, normal strength, sensation and reflexes    throughout  .    Assessment:    Healthy female exam.    Plan:     Breast self exam technique reviewed and patient encouraged to perform self-exam monthly. Thin prep Pap smear. with cotesting She plans to lose weight Fasting labs today

## 2015-01-22 ENCOUNTER — Telehealth: Payer: Self-pay | Admitting: *Deleted

## 2015-01-22 LAB — CYTOLOGY - PAP

## 2015-01-22 NOTE — Telephone Encounter (Signed)
LM on voicemail of normal lab results

## 2015-02-11 ENCOUNTER — Ambulatory Visit: Payer: BLUE CROSS/BLUE SHIELD

## 2015-02-16 ENCOUNTER — Ambulatory Visit (INDEPENDENT_AMBULATORY_CARE_PROVIDER_SITE_OTHER): Payer: BC Managed Care – PPO | Admitting: *Deleted

## 2015-02-16 DIAGNOSIS — Z3042 Encounter for surveillance of injectable contraceptive: Secondary | ICD-10-CM

## 2015-02-16 MED ORDER — MEDROXYPROGESTERONE ACETATE 150 MG/ML IM SUSP
150.0000 mg | INTRAMUSCULAR | Status: DC
  Administered 2015-02-16 – 2016-01-25 (×2): 150 mg via INTRAMUSCULAR

## 2015-04-24 ENCOUNTER — Ambulatory Visit (INDEPENDENT_AMBULATORY_CARE_PROVIDER_SITE_OTHER): Payer: BC Managed Care – PPO | Admitting: Family Medicine

## 2015-04-24 ENCOUNTER — Encounter: Payer: Self-pay | Admitting: Family Medicine

## 2015-04-24 VITALS — BP 138/85 | HR 93 | Ht 63.0 in | Wt 175.0 lb

## 2015-04-24 DIAGNOSIS — Z0000 Encounter for general adult medical examination without abnormal findings: Secondary | ICD-10-CM

## 2015-04-24 DIAGNOSIS — Z8249 Family history of ischemic heart disease and other diseases of the circulatory system: Secondary | ICD-10-CM | POA: Diagnosis not present

## 2015-04-24 DIAGNOSIS — R0789 Other chest pain: Secondary | ICD-10-CM | POA: Diagnosis not present

## 2015-04-24 DIAGNOSIS — Z0189 Encounter for other specified special examinations: Secondary | ICD-10-CM | POA: Diagnosis not present

## 2015-04-24 DIAGNOSIS — R011 Cardiac murmur, unspecified: Secondary | ICD-10-CM | POA: Diagnosis not present

## 2015-04-24 MED ORDER — TRIAMCINOLONE ACETONIDE 0.025 % EX OINT: TOPICAL_OINTMENT | Freq: Two times a day (BID) | CUTANEOUS | Status: DC

## 2015-04-24 NOTE — Progress Notes (Signed)
Subjective:     Kayla Munoz is a 39 y.o. female and is here for a comprehensive physical exam. The patient reports problems - had some sharp pain in both sides of her chest.  Lasted a minutes or two.  happened a couple of days. . She did not express any numbness or tingling or diaphoresis with this. She was at rest when this occurred. Coming in today bc she wants her heart checked.  Mother was dx with CHF in her last 38s.  She quit smoking 11 months ago. Has gained weight. Hasn't been exercising as much. No SOB.  Had some recent ankle swelling when she was at a concern when she got hot.   Occ discomfort in her upper legs.  Her GYN exam is up-to-date and she recently had full blood work back in April.  History   Social History  . Marital Status: Single    Spouse Name: N/A  . Number of Children: N/A  . Years of Education: N/A   Occupational History  . Not on file.   Social History Main Topics  . Smoking status: Former Smoker -- 0.50 packs/day for 12 years    Types: Cigarettes    Quit date: 05/19/2014  . Smokeless tobacco: Never Used  . Alcohol Use: 3.5 oz/week    7 drink(s) per week  . Drug Use: Yes    Special: Marijuana  . Sexual Activity: Not on file   Other Topics Concern  . Not on file   Social History Narrative   Health Maintenance  Topic Date Due  . INFLUENZA VACCINE  09/26/2015 (Originally 04/27/2015)  . PAP SMEAR  01/20/2018  . TETANUS/TDAP  07/30/2023  . HIV Screening  Completed    The following portions of the patient's history were reviewed and updated as appropriate: allergies, current medications, past family history, past medical history, past social history, past surgical history and problem list.  Review of Systems A comprehensive review of systems was negative.   Objective:    BP 138/85 mmHg  Pulse 93  Ht 5\' 3"  (1.6 m)  Wt 175 lb (79.379 kg)  BMI 31.01 kg/m2 General appearance: alert, cooperative and appears stated age Head: Normocephalic,  without obvious abnormality, atraumatic Eyes: conj clear, EOMI, PEERLA Ears: normal TM's and external ear canals both ears Nose: Nares normal. Septum midline. Mucosa normal. No drainage or sinus tenderness. Throat: lips, mucosa, and tongue normal; teeth and gums normal Neck: no adenopathy, no carotid bruit, no JVD, supple, symmetrical, trachea midline and thyroid not enlarged, symmetric, no tenderness/mass/nodules Back: symmetric, no curvature. ROM normal. No CVA tenderness. Lungs: clear to auscultation bilaterally Heart: 2/6 SEM best heard at left sternal border with a mid systolic click Abdomen: soft, non-tender; bowel sounds normal; no masses,  no organomegaly Extremities: extremities normal, atraumatic, no cyanosis or edema Pulses: 2+ and symmetric Skin: Skin color, texture, turgor normal. No rashes or lesions Lymph nodes: Cervical, supraclavicular, and axillary nodes normal. Neurologic: Alert and oriented X 3, normal strength and tone. Normal symmetric reflexes. Normal coordination and gait    Assessment:    Healthy female exam.      Plan:     See After Visit Summary for Counseling Recommendations   Keep up a regular exercise program and make sure you are eating a healthy diet Try to eat 4 servings of dairy a day, or if you are lactose intolerant take a calcium with vitamin D daily.  Your vaccines are up to date.   Atypical chest  pain-based on her description of chest pain over the weekend I think it's most likely benign. Maybe some chest wall discomfort but I am going to go ahead and do an EKG. Am a little concerned that her mother was diagnosed with congestive heart failure in her late 99s. They're not really clear on the cause.  New murmur - will schedule for echo. Again family history of heart failure at a very young age.  EKG shows rate of 75 bpm, normal sinus rhythm with normal axis and no acute ST-T wave changes.

## 2015-04-24 NOTE — Patient Instructions (Signed)
Keep up a regular exercise program and make sure you are eating a healthy diet Try to eat 4 servings of dairy a day, or if you are lactose intolerant take a calcium with vitamin D daily.  Your vaccines are up to date.   

## 2015-05-05 ENCOUNTER — Ambulatory Visit (HOSPITAL_COMMUNITY): Payer: BC Managed Care – PPO | Attending: Family Medicine

## 2015-05-05 ENCOUNTER — Other Ambulatory Visit: Payer: Self-pay

## 2015-05-05 ENCOUNTER — Ambulatory Visit (HOSPITAL_COMMUNITY): Payer: BC Managed Care – PPO

## 2015-05-05 DIAGNOSIS — R011 Cardiac murmur, unspecified: Secondary | ICD-10-CM | POA: Diagnosis not present

## 2015-05-08 ENCOUNTER — Ambulatory Visit (INDEPENDENT_AMBULATORY_CARE_PROVIDER_SITE_OTHER): Payer: BC Managed Care – PPO | Admitting: *Deleted

## 2015-05-08 DIAGNOSIS — Z3042 Encounter for surveillance of injectable contraceptive: Secondary | ICD-10-CM | POA: Diagnosis not present

## 2015-07-31 ENCOUNTER — Ambulatory Visit (INDEPENDENT_AMBULATORY_CARE_PROVIDER_SITE_OTHER): Payer: BC Managed Care – PPO | Admitting: *Deleted

## 2015-07-31 DIAGNOSIS — Z3042 Encounter for surveillance of injectable contraceptive: Secondary | ICD-10-CM

## 2015-10-26 ENCOUNTER — Ambulatory Visit (INDEPENDENT_AMBULATORY_CARE_PROVIDER_SITE_OTHER): Payer: BC Managed Care – PPO | Admitting: *Deleted

## 2015-10-26 DIAGNOSIS — Z3042 Encounter for surveillance of injectable contraceptive: Secondary | ICD-10-CM | POA: Diagnosis not present

## 2015-10-26 NOTE — Progress Notes (Signed)
Patient ID: Kayla Munoz, female   DOB: 1976/08/24, 40 y.o.   MRN: [id number] Pt here for routine Depo - Provera - no issues expressed

## 2016-01-25 ENCOUNTER — Ambulatory Visit (INDEPENDENT_AMBULATORY_CARE_PROVIDER_SITE_OTHER): Payer: BC Managed Care – PPO | Admitting: Obstetrics & Gynecology

## 2016-01-25 ENCOUNTER — Other Ambulatory Visit: Payer: BC Managed Care – PPO

## 2016-01-25 ENCOUNTER — Encounter: Payer: Self-pay | Admitting: Obstetrics & Gynecology

## 2016-01-25 VITALS — BP 126/78 | HR 95 | Ht 63.0 in | Wt 174.0 lb

## 2016-01-25 DIAGNOSIS — Z113 Encounter for screening for infections with a predominantly sexual mode of transmission: Secondary | ICD-10-CM

## 2016-01-25 DIAGNOSIS — Z01419 Encounter for gynecological examination (general) (routine) without abnormal findings: Secondary | ICD-10-CM | POA: Diagnosis not present

## 2016-01-25 DIAGNOSIS — Z1151 Encounter for screening for human papillomavirus (HPV): Secondary | ICD-10-CM

## 2016-01-25 DIAGNOSIS — Z3042 Encounter for surveillance of injectable contraceptive: Secondary | ICD-10-CM | POA: Diagnosis not present

## 2016-01-25 DIAGNOSIS — Z124 Encounter for screening for malignant neoplasm of cervix: Secondary | ICD-10-CM

## 2016-01-25 NOTE — Progress Notes (Signed)
Subjective:    Kayla Munoz is a 40 y.o. SAA P0  female who presents for an annual exam. The patient has no complaints today. She would like a pap smear.The patient is not currently sexually active.But a different partner since last year.  GYN screening history: last pap: was normal. The patient wears seatbelts: yes. The patient participates in regular exercise: yes. Has the patient ever been transfused or tattooed?: no. The patient reports that there is not domestic violence in her life.   Menstrual History: OB History    Gravida Para Term Preterm AB TAB SAB Ectopic Multiple Living   0               Menarche age: 71 No LMP recorded. Patient has had an injection.    The following portions of the patient's history were reviewed and updated as appropriate: allergies, current medications, past family history, past medical history, past social history, past surgical history and problem list.  Review of Systems Pertinent items noted in HPI and remainder of comprehensive ROS otherwise negative.  Works at [employer] and goes to school there. Declines a flu shot.   Objective:    BP 126/78 mmHg  Pulse 95  Ht 5\' 3"  (1.6 m)  Wt 174 lb (78.926 kg)  BMI 30.83 kg/m2  General Appearance:    Alert, cooperative, no distress, appears stated age  Head:    Normocephalic, without obvious abnormality, atraumatic  Eyes:    PERRL, conjunctiva/corneas clear, EOM's intact, fundi    benign, both eyes  Ears:    Normal TM's and external ear canals, both ears  Nose:   Nares normal, septum midline, mucosa normal, no drainage    or sinus tenderness  Throat:   Lips, mucosa, and tongue normal; teeth and gums normal  Neck:   Supple, symmetrical, trachea midline, no adenopathy;    thyroid:  no enlargement/tenderness/nodules; no carotid   bruit or JVD  Back:     Symmetric, no curvature, ROM normal, no CVA tenderness  Lungs:     Clear to auscultation bilaterally, respirations unlabored  Chest Wall:    No  tenderness or deformity   Heart:    Regular rate and rhythm, S1 and S2 normal, no murmur, rub   or gallop  Breast Exam:    No tenderness, masses, or nipple abnormality  Abdomen:     Soft, non-tender, bowel sounds active all four quadrants,    no masses, no organomegaly  Genitalia:    Normal female without lesion, discharge or tenderness, upper limit of normal size, mobile, NT, non-palpable adnexa     Extremities:   Extremities normal, atraumatic, no cyanosis or edema  Pulses:   2+ and symmetric all extremities  Skin:   Skin color, texture, turgor normal, no rashes or lesions  Lymph nodes:   Cervical, supraclavicular, and axillary nodes normal  Neurologic:   CNII-XII intact, normal strength, sensation and reflexes    throughout  .    Assessment:    Healthy female exam.    Plan:     Thin prep Pap smear. with cotesting STI testing She is considering another contraception since she has gained a little weight with depo and stopping smoking

## 2016-01-26 LAB — RPR

## 2016-01-26 LAB — VITAMIN D 25 HYDROXY (VIT D DEFICIENCY, FRACTURES): Vit D, 25-Hydroxy: 25 ng/mL — ABNORMAL LOW (ref 30–100)

## 2016-01-26 LAB — HIV ANTIBODY (ROUTINE TESTING W REFLEX): HIV 1&2 Ab, 4th Generation: NONREACTIVE

## 2016-01-26 LAB — HEPATITIS C ANTIBODY: HCV Ab: NEGATIVE

## 2016-01-26 LAB — HEPATITIS B SURFACE ANTIGEN: HEP B S AG: NEGATIVE

## 2016-01-26 LAB — TSH: TSH: 0.5 mIU/L

## 2016-01-28 LAB — CYTOLOGY - PAP

## 2016-02-02 ENCOUNTER — Telehealth: Payer: Self-pay | Admitting: *Deleted

## 2016-02-02 DIAGNOSIS — R7989 Other specified abnormal findings of blood chemistry: Secondary | ICD-10-CM

## 2016-02-02 MED ORDER — VITAMIN D (ERGOCALCIFEROL) 1.25 MG (50000 UNIT) PO CAPS: 50000.0000 [IU] | ORAL_CAPSULE | ORAL | Status: DC

## 2016-02-02 NOTE — Telephone Encounter (Signed)
-----   Message from Emily Filbert, MD sent at 01/29/2016  8:28 AM EDT ----- Her vitamin D is Low. She will need weekly Vitamind D 50,000 for 8 weeks and then a re check. Thanks

## 2016-02-02 NOTE — Telephone Encounter (Signed)
LM on voicemail of low Vitamin D level and RX was sent to [facility] in [place].  Pt to return in 8 weeks( 1 st week of July) for Recheck of bloodwork

## 2016-04-18 ENCOUNTER — Ambulatory Visit: Payer: BC Managed Care – PPO

## 2016-04-19 ENCOUNTER — Encounter (INDEPENDENT_AMBULATORY_CARE_PROVIDER_SITE_OTHER): Payer: Self-pay

## 2016-04-19 ENCOUNTER — Other Ambulatory Visit: Payer: Self-pay

## 2016-04-19 ENCOUNTER — Ambulatory Visit (INDEPENDENT_AMBULATORY_CARE_PROVIDER_SITE_OTHER): Payer: BC Managed Care – PPO | Admitting: Obstetrics & Gynecology

## 2016-04-19 ENCOUNTER — Encounter: Payer: Self-pay | Admitting: Obstetrics & Gynecology

## 2016-04-19 VITALS — BP 127/86 | HR 88 | Wt 184.0 lb

## 2016-04-19 DIAGNOSIS — N920 Excessive and frequent menstruation with regular cycle: Secondary | ICD-10-CM

## 2016-04-19 DIAGNOSIS — L309 Dermatitis, unspecified: Secondary | ICD-10-CM

## 2016-04-19 DIAGNOSIS — Z3009 Encounter for other general counseling and advice on contraception: Secondary | ICD-10-CM | POA: Diagnosis not present

## 2016-04-19 MED ORDER — TRIAMCINOLONE ACETONIDE 0.025 % EX OINT: TOPICAL_OINTMENT | Freq: Two times a day (BID) | CUTANEOUS | 0 refills | Status: DC

## 2016-04-19 MED ORDER — KETOCONAZOLE 2 % EX SHAM: MEDICATED_SHAMPOO | CUTANEOUS | 2 refills | Status: DC

## 2016-04-19 MED ORDER — MISOPROSTOL 200 MCG PO TABS: ORAL_TABLET | ORAL | 0 refills | Status: DC

## 2016-04-19 NOTE — Progress Notes (Signed)
   Subjective:    Patient ID: Kayla Munoz, female    DOB: Aug 25, 1976, 40 y.o.   MRN: [id number]  HPI 40 yo P0 lady here to discuss contraception. She has been on depo provera for about 2 years but she has gained about 30 pounds. She also quit smoking during that time frame. She has fibroids and that is why she is on depo provera, to try to decrease the amount of her bleeding.  She doesn't think that she will remember to take an OCP daily. Review of Systems     Objective:   Physical Exam  [id number] Breathing, conversing, and ambulating normally      Assessment & Plan:  Desire for contraception and management of menorrhagia- rec IUD since she doesn't think that she will remember OCPs. I will pretreat with cytotec. CONDOMS prn.

## 2016-05-12 ENCOUNTER — Ambulatory Visit: Payer: BC Managed Care – PPO | Admitting: Obstetrics & Gynecology

## 2016-05-16 ENCOUNTER — Ambulatory Visit (INDEPENDENT_AMBULATORY_CARE_PROVIDER_SITE_OTHER): Payer: BC Managed Care – PPO | Admitting: Obstetrics & Gynecology

## 2016-05-16 ENCOUNTER — Encounter: Payer: Self-pay | Admitting: Obstetrics & Gynecology

## 2016-05-16 VITALS — BP 118/78 | HR 91 | Ht 64.0 in | Wt 180.0 lb

## 2016-05-16 DIAGNOSIS — Z30430 Encounter for insertion of intrauterine contraceptive device: Secondary | ICD-10-CM | POA: Diagnosis not present

## 2016-05-16 DIAGNOSIS — Z3202 Encounter for pregnancy test, result negative: Secondary | ICD-10-CM

## 2016-05-16 MED ORDER — LEVONORGESTREL 18.6 MCG/DAY IU IUD: INTRAUTERINE_SYSTEM | Freq: Once | INTRAUTERINE | Status: DC

## 2016-05-16 MED ORDER — LEVONORGESTREL 13.5 MG IU IUD
INTRAUTERINE_SYSTEM | Freq: Once | INTRAUTERINE | Status: AC
  Administered 2016-05-16: 14:00:00 via INTRAUTERINE

## 2016-05-16 NOTE — Progress Notes (Signed)
   Subjective:    Patient ID: Kayla Munoz, female    DOB: November 27, 1975, 40 y.o.   MRN: [id number]  HPI  40 yo lady here for insertion of Mirena. She took the cytotec this morning as opposed to the prescribed time of last night.  Review of Systems     Objective:   Physical Exam UPT negative, consent signed, Time out procedure done. Cervix prepped with betadine and grasped with a single tooth tenaculum.  I attempted to place a [place] and then a Mirena but neither one would go so then I used a Skyla and it was placed. I attempted the insertion with the tenaculum on both the anterior and posterior lips of the cervix. The strings were cut to 3-4 cm. Uterus sounded to 9 cm. She tolerated the procedure well.         Assessment & Plan:  Contraception- Skyla Rec back up for 2 weeks

## 2016-05-16 NOTE — Addendum Note (Signed)
Addended by: Asencion Islam on: 05/16/2016 02:28 PM   Modules accepted: Orders

## 2016-06-06 ENCOUNTER — Ambulatory Visit (INDEPENDENT_AMBULATORY_CARE_PROVIDER_SITE_OTHER): Payer: BC Managed Care – PPO | Admitting: Family Medicine

## 2016-06-06 ENCOUNTER — Encounter: Payer: Self-pay | Admitting: Family Medicine

## 2016-06-06 VITALS — BP 115/67 | HR 74 | Ht 64.0 in | Wt 179.0 lb

## 2016-06-06 DIAGNOSIS — R7989 Other specified abnormal findings of blood chemistry: Secondary | ICD-10-CM

## 2016-06-06 DIAGNOSIS — Z23 Encounter for immunization: Secondary | ICD-10-CM

## 2016-06-06 DIAGNOSIS — Z01419 Encounter for gynecological examination (general) (routine) without abnormal findings: Secondary | ICD-10-CM

## 2016-06-06 DIAGNOSIS — Z833 Family history of diabetes mellitus: Secondary | ICD-10-CM

## 2016-06-06 DIAGNOSIS — M654 Radial styloid tenosynovitis [de Quervain]: Secondary | ICD-10-CM

## 2016-06-06 DIAGNOSIS — Z0000 Encounter for general adult medical examination without abnormal findings: Secondary | ICD-10-CM | POA: Diagnosis not present

## 2016-06-06 LAB — LIPID PANEL
CHOL/HDL RATIO: 2.2 ratio (ref <=5.0)
Cholesterol: 125 mg/dL (ref 125–200)
HDL: 57 mg/dL (ref >=46)
LDL CALC: 62 mg/dL (ref <130)
TRIGLYCERIDES: 31 mg/dL (ref <150)
VLDL: 6 mg/dL (ref <30)

## 2016-06-06 LAB — HEMOGLOBIN A1C
Hgb A1c MFr Bld: 5.3 % (ref <5.7)
MEAN PLASMA GLUCOSE: 105 mg/dL

## 2016-06-06 LAB — COMPLETE METABOLIC PANEL WITH GFR
ALT: 16 U/L (ref 6–29)
AST: 16 U/L (ref 10–30)
Albumin: 3.9 g/dL (ref 3.6–5.1)
Alkaline Phosphatase: 35 U/L (ref 33–115)
BUN: 9 mg/dL (ref 7–25)
CHLORIDE: 105 mmol/L (ref 98–110)
CO2: 26 mmol/L (ref 20–31)
Calcium: 9.1 mg/dL (ref 8.6–10.2)
Creat: 0.71 mg/dL (ref 0.50–1.10)
GFR, Est African American: >89 mL/min (ref >=60)
GLUCOSE: 87 mg/dL (ref 65–99)
POTASSIUM: 4 mmol/L (ref 3.5–5.3)
SODIUM: 140 mmol/L (ref 135–146)
TOTAL PROTEIN: 6.2 g/dL (ref 6.1–8.1)
Total Bilirubin: 0.6 mg/dL (ref 0.2–1.2)

## 2016-06-06 NOTE — Patient Instructions (Signed)
Keep up a regular exercise program and make sure you are eating a healthy diet Try to eat 4 servings of dairy a day, or if you are lactose intolerant take a calcium with vitamin D daily.  Your vaccines are up to date.   

## 2016-06-06 NOTE — Progress Notes (Addendum)
Subjective:     Kayla Munoz is a 40 y.o. female and is here for a comprehensive physical exam. The patient reports no problems.  Patient does complain of bilateral wrist pain just below the thumb. She says it usually happens after certain activities and then will sometimes actually radiate up into her thumb. She denies any trauma or injury. It's not really bothering her today. She does work on a computer for hours at a time.  Social History   Social History  . Marital status: Single    Spouse name: N/A  . Number of children: N/A  . Years of education: N/A   Occupational History  . Not on file.   Social History Main Topics  . Smoking status: Former Smoker    Packs/day: 0.50    Years: 12.00    Types: Cigarettes    Quit date: 05/19/2014  . Smokeless tobacco: Never Used  . Alcohol use 3.5 oz/week    7 Standard drinks or equivalent per week  . Drug use:     Types: Marijuana  . Sexual activity: Not Currently    Birth control/ protection: Injection   Other Topics Concern  . Not on file   Social History Narrative  . No narrative on file   Health Maintenance  Topic Date Due  . PAP SMEAR  01/25/2019  . TETANUS/TDAP  07/30/2023  . INFLUENZA VACCINE  Addressed  . HIV Screening  Completed    The following portions of the patient's history were reviewed and updated as appropriate: allergies, current medications, past family history, past medical history, past social history, past surgical history and problem list.  Review of Systems A comprehensive review of systems was negative.   Objective:    BP 115/67 (BP Location: Right Arm, Patient Position: Sitting, Cuff Size: Normal)   Pulse 74   Ht 5\' 4"  (1.626 m)   Wt 179 lb (81.2 kg)   SpO2 100%   BMI 30.73 kg/m  General appearance: alert, cooperative and appears stated age Head: Normocephalic, without obvious abnormality, atraumatic Eyes: conj clear, EOMI, PEERLA Ears: normal TM's and external ear canals both  ears Nose: Nares normal. Septum midline. Mucosa normal. No drainage or sinus tenderness. Throat: lips, mucosa, and tongue normal; teeth and gums normal Neck: no adenopathy, no carotid bruit, no JVD, supple, symmetrical, trachea midline and thyroid not enlarged, symmetric, no tenderness/mass/nodules Back: symmetric, no curvature. ROM normal. No CVA tenderness. Lungs: clear to auscultation bilaterally Breasts: normal appearance, no masses or tenderness Heart: regular rate and rhythm, S1, S2 normal, no murmur, click, rub or gallop Abdomen: soft, non-tender; bowel sounds normal; no masses,  no organomegaly Extremities: extremities normal, atraumatic, no cyanosis or edema. Wrist with no rash or swelling or edema. Nontender on exam. Fingers with normal range of motion and strength. Positive Finkelstein test on the left. Negative on the right. Pulses: 2+ and symmetric Skin: Skin color, texture, turgor normal. No rashes or lesions Lymph nodes: Cervical, supraclavicular, and axillary nodes normal. Neurologic: Alert and oriented X 3, normal strength and tone. Normal symmetric reflexes. Normal coordination and gait    Assessment:    Healthy female exam.      Plan:     See After Visit Summary for Counseling Recommendations    Keep up a regular exercise program and make sure you are eating a healthy diet Try to eat 4 servings of dairy a day, or if you are lactose intolerant take a calcium with vitamin D daily.  Your  vaccines are up to date.  Flu vac given.  Labs ordered.   De Quervain's tenosynovitis-given handout with information on stretches icing and anti-inflammatories as needed. If not improving them please let me know.

## 2016-06-07 LAB — VITAMIN D 25 HYDROXY (VIT D DEFICIENCY, FRACTURES): VIT D 25 HYDROXY: 26 ng/mL — AB (ref 30–100)

## 2017-05-09 ENCOUNTER — Other Ambulatory Visit: Payer: Self-pay | Admitting: Obstetrics & Gynecology

## 2017-05-09 DIAGNOSIS — L309 Dermatitis, unspecified: Secondary | ICD-10-CM

## 2017-05-15 ENCOUNTER — Other Ambulatory Visit: Payer: Self-pay

## 2017-05-15 DIAGNOSIS — L309 Dermatitis, unspecified: Secondary | ICD-10-CM

## 2017-05-15 MED ORDER — KETOCONAZOLE 2 % EX SHAM: MEDICATED_SHAMPOO | CUTANEOUS | 99 refills | Status: DC

## 2017-06-07 ENCOUNTER — Encounter: Payer: BC Managed Care – PPO | Admitting: Family Medicine

## 2017-06-26 ENCOUNTER — Encounter: Payer: Self-pay | Admitting: Family Medicine

## 2017-06-26 ENCOUNTER — Ambulatory Visit (INDEPENDENT_AMBULATORY_CARE_PROVIDER_SITE_OTHER): Payer: BC Managed Care – PPO | Admitting: Family Medicine

## 2017-06-26 VITALS — BP 121/68 | HR 70 | Ht 64.0 in | Wt 189.0 lb

## 2017-06-26 DIAGNOSIS — L219 Seborrheic dermatitis, unspecified: Secondary | ICD-10-CM

## 2017-06-26 DIAGNOSIS — E559 Vitamin D deficiency, unspecified: Secondary | ICD-10-CM

## 2017-06-26 DIAGNOSIS — L309 Dermatitis, unspecified: Secondary | ICD-10-CM

## 2017-06-26 DIAGNOSIS — Z0000 Encounter for general adult medical examination without abnormal findings: Secondary | ICD-10-CM

## 2017-06-26 DIAGNOSIS — Z23 Encounter for immunization: Secondary | ICD-10-CM

## 2017-06-26 MED ORDER — TRIAMCINOLONE ACETONIDE 0.025 % EX OINT: TOPICAL_OINTMENT | Freq: Two times a day (BID) | CUTANEOUS | 2 refills | Status: AC

## 2017-06-26 MED ORDER — KETOCONAZOLE 2 % EX SHAM: MEDICATED_SHAMPOO | CUTANEOUS | 99 refills | Status: DC

## 2017-06-26 NOTE — Progress Notes (Signed)
All labs are normal. 

## 2017-06-26 NOTE — Patient Instructions (Addendum)

## 2017-06-26 NOTE — Progress Notes (Signed)
Subjective:     Kayla Munoz is a 41 y.o. female and is here for a comprehensive physical exam. The patient reports no problems. Doing well overall. She's not quit smoking from his 3 years. She exercises 4-5 days per week. She denies any chest pain or shortness of breath with exercise. Occasionally gets some wrist pain with repetitive activities but not very bothersome.  Social History   Social History  . Marital status: Single    Spouse name: N/A  . Number of children: N/A  . Years of education: N/A   Occupational History  . Not on file.   Social History Main Topics  . Smoking status: Former Smoker    Packs/day: 0.50    Years: 12.00    Types: Cigarettes    Quit date: 05/19/2014  . Smokeless tobacco: Never Used  . Alcohol use 3.5 oz/week    7 Standard drinks or equivalent per week  . Drug use: Yes    Types: Marijuana  . Sexual activity: Not Currently    Birth control/ protection: Injection   Other Topics Concern  . Not on file   Social History Narrative  . No narrative on file   Health Maintenance  Topic Date Due  . INFLUENZA VACCINE  04/26/2017  . PAP SMEAR  01/25/2019  . TETANUS/TDAP  07/30/2023  . HIV Screening  Completed    The following portions of the patient's history were reviewed and updated as appropriate: allergies, current medications, past family history, past medical history, past social history, past surgical history and problem list.  Review of Systems A comprehensive review of systems was negative.   Objective:    BP 121/68   Pulse 70   Ht 5\' 4"  (1.626 m)   Wt 189 lb (85.7 kg)   SpO2 98%   BMI 32.44 kg/m  General appearance: alert, cooperative and appears stated age Head: Normocephalic, without obvious abnormality, atraumatic Eyes: conj clear, EOMI, PEERLA Ears: normal TM's and external ear canals both ears Nose: Nares normal. Septum midline. Mucosa normal. No drainage or sinus tenderness. Throat: lips, mucosa, and tongue normal; teeth  and gums normal Neck: no adenopathy, no carotid bruit, no JVD, supple, symmetrical, trachea midline and thyroid not enlarged, symmetric, no tenderness/mass/nodules Back: symmetric, no curvature. ROM normal. No CVA tenderness. Lungs: clear to auscultation bilaterally Breasts: normal appearance, no masses or tenderness Heart: regular rate and rhythm, S1, S2 normal, no murmur, click, rub or gallop Abdomen: soft, non-tender; bowel sounds normal; no masses,  no organomegaly Extremities: extremities normal, atraumatic, no cyanosis or edema Pulses: 2+ and symmetric Skin: Skin color, texture, turgor normal. No rashes or lesions Lymph nodes: Cervical, supraclavicular, and axillary nodes normal. Neurologic: Alert and oriented X 3, normal strength and tone. Normal symmetric reflexes. Normal coordination and gait    Assessment:    Healthy female exam.      Plan:     See After Visit Summary for Counseling Recommendations   Keep up a regular exercise program and make sure you are eating a healthy diet Try to eat 4 servings of dairy a day, or if you are lactose intolerant take a calcium with vitamin D daily.  Your vaccines are up to date.   Seborrheic dermatitis - will refill cream and shampoo.

## 2017-06-27 LAB — LIPID PANEL
CHOLESTEROL: 124 mg/dL (ref <200)
HDL: 49 mg/dL — ABNORMAL LOW (ref >50)
LDL CHOLESTEROL (CALC): 65 mg/dL
Non-HDL Cholesterol (Calc): 75 mg/dL (calc) (ref <130)
TRIGLYCERIDES: 34 mg/dL (ref <150)
Total CHOL/HDL Ratio: 2.5 (calc) (ref <5.0)

## 2017-06-27 LAB — COMPLETE METABOLIC PANEL WITH GFR
AG RATIO: 1.6 (calc) (ref 1.0–2.5)
ALKALINE PHOSPHATASE (APISO): 36 U/L (ref 33–115)
ALT: 9 U/L (ref 6–29)
AST: 14 U/L (ref 10–30)
Albumin: 3.9 g/dL (ref 3.6–5.1)
BILIRUBIN TOTAL: 0.5 mg/dL (ref 0.2–1.2)
BUN: 10 mg/dL (ref 7–25)
CHLORIDE: 106 mmol/L (ref 98–110)
CO2: 27 mmol/L (ref 20–32)
Calcium: 9 mg/dL (ref 8.6–10.2)
Creat: 0.83 mg/dL (ref 0.50–1.10)
GFR, Est African American: 103 mL/min/{1.73_m2} (ref >=60)
GFR, Est Non African American: 89 mL/min/{1.73_m2} (ref >=60)
Globulin: 2.4 g/dL (calc) (ref 1.9–3.7)
Glucose, Bld: 86 mg/dL (ref 65–99)
POTASSIUM: 4.3 mmol/L (ref 3.5–5.3)
Sodium: 139 mmol/L (ref 135–146)
Total Protein: 6.3 g/dL (ref 6.1–8.1)

## 2017-06-27 LAB — VITAMIN D 25 HYDROXY (VIT D DEFICIENCY, FRACTURES): VIT D 25 HYDROXY: 20 ng/mL — AB (ref 30–100)

## 2017-07-11 ENCOUNTER — Ambulatory Visit (INDEPENDENT_AMBULATORY_CARE_PROVIDER_SITE_OTHER): Payer: BC Managed Care – PPO | Admitting: Obstetrics & Gynecology

## 2017-07-11 ENCOUNTER — Encounter: Payer: Self-pay | Admitting: Obstetrics & Gynecology

## 2017-07-11 VITALS — BP 124/75 | HR 90 | Resp 16 | Ht 63.0 in | Wt 191.0 lb

## 2017-07-11 DIAGNOSIS — Z30431 Encounter for routine checking of intrauterine contraceptive device: Secondary | ICD-10-CM

## 2017-07-11 NOTE — Progress Notes (Signed)
   Subjective:    Patient ID: Kayla Munoz, female    DOB: Aug 16, 1976, 41 y.o.   MRN: [id number]  HPI 41 yo lady here for a string check. I placed a Skyla 8/18 after I could not pass a Mirena or Liletta. She is happy with the IUD, has some spotting. Feels better than she did with depo provera.   Review of Systems She has quit smoking.   Pap normal 5/17. Objective:   Physical Exam Breathing, conversing, and ambulating normally Well nourished, well hydrated Black female, no apparent distress Her IUD string were not visible with a speculum exam A bedsideTVS showed the IUD to be in the upper portion of the uterus, correct placement     Assessment & Plan:  Contraception- continue [facility]

## 2017-08-23 ENCOUNTER — Ambulatory Visit (INDEPENDENT_AMBULATORY_CARE_PROVIDER_SITE_OTHER): Payer: BC Managed Care – PPO | Admitting: Family Medicine

## 2017-08-23 ENCOUNTER — Encounter: Payer: Self-pay | Admitting: Family Medicine

## 2017-08-23 VITALS — BP 105/60 | HR 80 | Ht 63.0 in | Wt 186.0 lb

## 2017-08-23 DIAGNOSIS — R41840 Attention and concentration deficit: Secondary | ICD-10-CM

## 2017-08-23 NOTE — Progress Notes (Signed)
Subjective:    Patient ID: Kayla Munoz, female    DOB: [birth year], 41 y.o.   MRN: [id number]  HPI   41 year old female comes in today to discuss possibility of having ADD. She reports that for years she's had a hard time focusing. She says that when she quit smoking about 3 years ago she felt like it got worse. She said a lot of times she would actually smoke to help her focus and concentrate. Now that she is back in school in addition to working full time and having any more busy job over the last 4 years she's really feeling the strain of being able to focus and pay attention and get work done. 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 She gets very easily distracted. She says that sometimes her friends tell her that she talks too much. She admits she has a very difficult time managing her time. She doesn't lose things that she says she is very consistent about placing things in a specific area.  Review of Systems  BP 105/60   Pulse 80   Ht 5\' 3"  (1.6 m)   Wt 186 lb (84.4 kg)   SpO2 100%   BMI 32.95 kg/m     No Known Allergies  Past Medical History:  Diagnosis Date  . Anemia   . Fibroids     Past Surgical History:  Procedure Laterality Date  . UTERINE FIBROID SURGERY  04/2011    Social History   Socioeconomic History  . Marital status: Single    Spouse name: Not on file  . Number of children: Not on file  . Years of education: Not on file  . Highest education level: Not on file  Social Needs  . Financial resource strain: Not on file  . Food insecurity - worry: Not on file  . Food insecurity - inability: Not on file  . Transportation needs - medical: Not on file  . Transportation needs - non-medical: Not on file  Occupational History  . Not on file  Tobacco Use  . Smoking status: Former Smoker    Packs/day: 0.50    Years: 12.00    Pack years: 6.00    Types: Cigarettes    Last attempt to quit:  05/19/2014    Years since quitting: 3.2  . Smokeless tobacco: Never Used  Substance and Sexual Activity  . Alcohol use: Yes    Alcohol/week: 3.5 oz    Types: 7 Standard drinks or equivalent per week  . Drug use: Yes    Types: Marijuana  . Sexual activity: Not Currently    Birth control/protection: IUD  Other Topics Concern  . Not on file  Social History Narrative  . Not on file    Family History  Problem Relation Age of Onset  . Diabetes Father   . Hypertension Father   . Diabetes Mother   . Heart disease Mother   . Hypertension Mother   . Breast cancer Neg Hx   . Colon cancer Neg Hx   . Ovarian cancer Neg Hx     Outpatient Encounter Medications as of 08/23/2017  Medication Sig  . cholecalciferol (VITAMIN D) 1000 units tablet Take 1,000 Units by mouth daily.  Marland Kitchen ketoconazole (NIZORAL) 2 % shampoo Wash affected areas once a day  . Levonorgestrel (SKYLA) 13.5 MG IUD by Intrauterine route.  . triamcinolone (KENALOG) 0.025 % ointment Apply topically 2 (two) times daily.  No facility-administered encounter medications on file as of 08/23/2017.          Objective:   Physical Exam  Constitutional: She is oriented to person, place, and time. She appears well-developed and well-nourished.  HENT:  Head: Normocephalic and atraumatic.  Eyes: Conjunctivae and EOM are normal.  Cardiovascular: Normal rate.  Pulmonary/Chest: Effort normal.  Neurological: She is alert and oriented to person, place, and time.  Skin: Skin is dry. No pallor.  Psychiatric: She has a normal mood and affect. Her behavior is normal.  Vitals reviewed.      Assessment & Plan:  Inattention-we'll place referral for further testing and evaluation.  She completed. The ADHD questionnaire with adult prompts.  Score of 39. Please see scanned sheet. Significant for possible ADHD. Will refer more formal frontal formal testing over at behavioral health at [facility]. I do not suspect any other underlying mental  health concerns such as depression or anxiety. Certainly discuss treatment options when she has a more formal diagnosis in place.  Time spent 25 minutes, greater than 50% time spent discussing and counseling about inattention.

## 2017-08-30 ENCOUNTER — Telehealth: Payer: Self-pay

## 2017-08-30 NOTE — Telephone Encounter (Signed)
Needs and closer appointment for ADD evaluation.

## 2018-10-26 ENCOUNTER — Emergency Department (INDEPENDENT_AMBULATORY_CARE_PROVIDER_SITE_OTHER)
Admission: EM | Admit: 2018-10-26 | Discharge: 2018-10-26 | Disposition: A | Discharge status: Home / Self Care | Payer: Self-pay | Source: Home / Self Care | Attending: Family Medicine | Admitting: Family Medicine

## 2018-10-26 ENCOUNTER — Other Ambulatory Visit: Payer: Self-pay

## 2018-10-26 ENCOUNTER — Encounter: Payer: Self-pay | Admitting: Emergency Medicine

## 2018-10-26 ENCOUNTER — Emergency Department (INDEPENDENT_AMBULATORY_CARE_PROVIDER_SITE_OTHER): Payer: BC Managed Care – PPO

## 2018-10-26 DIAGNOSIS — M542 Cervicalgia: Secondary | ICD-10-CM | POA: Diagnosis not present

## 2018-10-26 DIAGNOSIS — S139XXA Sprain of joints and ligaments of unspecified parts of neck, initial encounter: Secondary | ICD-10-CM

## 2018-10-26 MED ORDER — CYCLOBENZAPRINE HCL 10 MG PO TABS: ORAL_TABLET | ORAL | 0 refills | Status: DC
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
## 2018-10-26 NOTE — Discharge Instructions (Addendum)
Apply ice pack for 20 to 30 minutes, 3 to 4 times daily  Continue until pain and swelling decrease.  Begin Aleve, 2 tabs every 12 hours with food.  Begin range of motion and stretching exercises as tolerated.
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
## 2018-11-13 ENCOUNTER — Encounter: Payer: BC Managed Care – PPO | Admitting: Family Medicine

## 2018-11-14 ENCOUNTER — Encounter: Payer: Self-pay | Admitting: Family Medicine

## 2018-11-14 ENCOUNTER — Ambulatory Visit (INDEPENDENT_AMBULATORY_CARE_PROVIDER_SITE_OTHER): Payer: BC Managed Care – PPO | Admitting: Family Medicine

## 2018-11-14 VITALS — BP 128/76 | HR 86 | Ht 66.0 in | Wt 198.0 lb

## 2018-11-14 DIAGNOSIS — N62 Hypertrophy of breast: Secondary | ICD-10-CM

## 2018-11-14 DIAGNOSIS — Z1231 Encounter for screening mammogram for malignant neoplasm of breast: Secondary | ICD-10-CM

## 2018-11-14 DIAGNOSIS — Z0000 Encounter for general adult medical examination without abnormal findings: Secondary | ICD-10-CM

## 2018-11-14 NOTE — Progress Notes (Signed)
Subjective:     Kayla Munoz is a 43 y.o. female and is here for a comprehensive physical exam. The patient reports no problems.  She is doing well overall.  She does feel like overall she eats pretty healthy.  She has been trying to exercise some.  She admits that she needs to get some of the weight off that she gained while she was on the Depakote shot.  She now has an IUD in place after having her fibroid surgery.  Her her father has been in the hospital for the last 2 weeks with some type of colon obstruction that caused acute renal failure and he now has a colostomy bag.  He also wanted to discuss her large breast.  She says even when she loses weight she never seems to change cup sizes.  She says she is noting starting to get some pain in her shoulders from her bra straps as well as neck pain.  Social History   Socioeconomic History  . Marital status: Single    Spouse name: Not on file  . Number of children: Not on file  . Years of education: Not on file  . Highest education level: Not on file  Occupational History  . Not on file  Social Needs  . Financial resource strain: Not on file  . Food insecurity:    Worry: Not on file    Inability: Not on file  . Transportation needs:    Medical: Not on file    Non-medical: Not on file  Tobacco Use  . Smoking status: Former Smoker    Packs/day: 0.50    Years: 12.00    Pack years: 6.00    Types: Cigarettes    Last attempt to quit: 05/19/2014    Years since quitting: 4.4  . Smokeless tobacco: Never Used  Substance and Sexual Activity  . Alcohol use: Yes    Alcohol/week: 7.0 standard drinks    Types: 7 Standard drinks or equivalent per week  . Drug use: Yes    Types: Marijuana  . Sexual activity: Not Currently    Birth control/protection: I.U.D.  Lifestyle  . Physical activity:    Days per week: Not on file    Minutes per session: Not on file  . Stress: Not on file  Relationships  . Social connections:    Talks on phone:  Not on file    Gets together: Not on file    Attends religious service: Not on file    Active member of club or organization: Not on file    Attends meetings of clubs or organizations: Not on file    Relationship status: Not on file  . Intimate partner violence:    Fear of current or ex partner: Not on file    Emotionally abused: Not on file    Physically abused: Not on file    Forced sexual activity: Not on file  Other Topics Concern  . Not on file  Social History Narrative  . Not on file   Health Maintenance  Topic Date Due  . INFLUENZA VACCINE  12/25/2018 (Originally 04/26/2018)  . PAP SMEAR-Modifier  01/25/2019  . TETANUS/TDAP  07/30/2023  . HIV Screening  Completed    The following portions of the patient's history were reviewed and updated as appropriate: allergies, current medications, past family history, past medical history, past social history, past surgical history and problem list.  Review of Systems A comprehensive review of systems was negative.   Objective:  BP 128/76   Pulse 86   Ht 5\' 6"  (1.676 m)   Wt 198 lb (89.8 kg)   SpO2 99%   BMI 31.96 kg/m  General appearance: alert, cooperative and appears stated age Head: Normocephalic, without obvious abnormality, atraumatic Eyes: conj clear, EOMI, PEERLA Ears: normal TM's and external ear canals both ears Nose: Nares normal. Septum midline. Mucosa normal. No drainage or sinus tenderness. Throat: lips, mucosa, and tongue normal; teeth and gums normal Neck: no adenopathy, no carotid bruit, no JVD, supple, symmetrical, trachea midline and thyroid not enlarged, symmetric, no tenderness/mass/nodules Back: symmetric, no curvature. ROM normal. No CVA tenderness. Lungs: clear to auscultation bilaterally Breasts: normal appearance, no masses or tenderness Heart: regular rate and rhythm, S1, S2 normal, no murmur, click, rub or gallop Abdomen: soft, non-tender; bowel sounds normal; no masses,  no  organomegaly Extremities: extremities normal, atraumatic, no cyanosis or edema Pulses: 2+ and symmetric Skin: Skin color, texture, turgor normal. No rashes or lesions Lymph nodes: Cervical, supraclavicular, and axillary nodes normal. Neurologic: Alert and oriented X 3, normal strength and tone. Normal symmetric reflexes. Normal coordination and gait    Assessment:    Healthy female exam.      Plan:     See After Visit Summary for Counseling Recommendations   Keep up a regular exercise program and make sure you are eating a healthy diet Try to eat 4 servings of dairy a day, or if you are lactose intolerant take a calcium with vitamin D daily.  Your vaccines are up to date.   Large breast - continue to work on weight loss. OK to use NSAID and stretches.  Can always consider consultation with plastic surgery for breast reduction at some point if she desires.

## 2018-11-14 NOTE — Patient Instructions (Signed)

## 2018-11-15 LAB — COMPLETE METABOLIC PANEL WITH GFR
AG RATIO: 1.4 (calc) (ref 1.0–2.5)
ALKALINE PHOSPHATASE (APISO): 43 U/L (ref 31–125)
ALT: 11 U/L (ref 6–29)
AST: 14 U/L (ref 10–30)
Albumin: 4 g/dL (ref 3.6–5.1)
BILIRUBIN TOTAL: 0.4 mg/dL (ref 0.2–1.2)
BUN: 10 mg/dL (ref 7–25)
CALCIUM: 9.3 mg/dL (ref 8.6–10.2)
CHLORIDE: 105 mmol/L (ref 98–110)
CO2: 28 mmol/L (ref 20–32)
Creat: 0.78 mg/dL (ref 0.50–1.10)
GFR, EST NON AFRICAN AMERICAN: 95 mL/min/{1.73_m2} (ref >=60)
GFR, Est African American: 110 mL/min/{1.73_m2} (ref >=60)
GLOBULIN: 2.8 g/dL (ref 1.9–3.7)
Glucose, Bld: 92 mg/dL (ref 65–99)
POTASSIUM: 3.9 mmol/L (ref 3.5–5.3)
SODIUM: 139 mmol/L (ref 135–146)
Total Protein: 6.8 g/dL (ref 6.1–8.1)

## 2018-11-15 LAB — CBC
HEMATOCRIT: 39.4 % (ref 35.0–45.0)
Hemoglobin: 13.7 g/dL (ref 11.7–15.5)
MCH: 33.3 pg — AB (ref 27.0–33.0)
MCHC: 34.8 g/dL (ref 32.0–36.0)
MCV: 95.6 fL (ref 80.0–100.0)
MPV: 10.2 fL (ref 7.5–12.5)
PLATELETS: 320 10*3/uL (ref 140–400)
RBC: 4.12 10*6/uL (ref 3.80–5.10)
RDW: 13.5 % (ref 11.0–15.0)
WBC: 8.8 10*3/uL (ref 3.8–10.8)

## 2018-11-15 LAB — LIPID PANEL
Cholesterol: 142 mg/dL (ref <200)
HDL: 53 mg/dL (ref >=50)
LDL Cholesterol (Calc): 78 mg/dL (calc)
NON-HDL CHOLESTEROL (CALC): 89 mg/dL (ref <130)
Total CHOL/HDL Ratio: 2.7 (calc) (ref <5.0)
Triglycerides: 37 mg/dL (ref <150)

## 2018-11-15 LAB — VITAMIN D 25 HYDROXY (VIT D DEFICIENCY, FRACTURES): VIT D 25 HYDROXY: 24 ng/mL — AB (ref 30–100)

## 2018-11-26 ENCOUNTER — Encounter: Payer: Self-pay | Admitting: Family Medicine

## 2018-11-26 ENCOUNTER — Ambulatory Visit (INDEPENDENT_AMBULATORY_CARE_PROVIDER_SITE_OTHER): Payer: BC Managed Care – PPO | Admitting: Family Medicine

## 2018-11-26 VITALS — BP 143/80 | HR 86 | Ht 63.0 in | Wt 196.0 lb

## 2018-11-26 DIAGNOSIS — R202 Paresthesia of skin: Secondary | ICD-10-CM

## 2018-11-26 DIAGNOSIS — E559 Vitamin D deficiency, unspecified: Secondary | ICD-10-CM | POA: Insufficient documentation

## 2018-11-26 HISTORY — DX: Vitamin D deficiency, unspecified: E55.9

## 2018-11-26 NOTE — Patient Instructions (Addendum)
Thank you for coming in today. The name for the numb tingly symptoms are paresthesia.  We will keep an eye on them.  I do not think they represent a bad pinched nerve or a bad neurological problem.   Vit D defence. Increase to 5000 units daily for 3 months then take 1000 units daily.   Keep me updated on the symptoms.  If they worsen.    Paresthesia Paresthesia is an abnormal burning or prickling sensation. It is usually felt in the hands, arms, legs, or feet. However, it may occur in any part of the body. Usually, paresthesia is not painful. It may feel like:  Tingling or numbness.  Pins and needles.  Skin crawling.  Buzzing.  Arms or legs falling asleep.  Itching. Paresthesia may occur without any clear cause, or it may be caused by:  Breathing too quickly (hyperventilation).  Pressure on a nerve.  An underlying medical condition.  Side effects of a medication.  Nutritional deficiencies.  Exposure to toxic chemicals. Most people experience temporary (transient) paresthesia at some time in their lives. For some people, it may be long-lasting (chronic) because of an underlying medical condition. If you have paresthesia that lasts a long time, you may need to be evaluated by your health care provider. Follow these instructions at home: Alcohol use   Do not drink alcohol if: ? Your health care provider tells you not to drink. ? You are pregnant, may be pregnant, or are planning to become pregnant.  If you drink alcohol, limit how much you have: ? 0-1 drink a day for women. ? 0-2 drinks a day for men.  Be aware of how much alcohol is in your drink. In the [place] one drink equals one typical bottle of beer (12 oz), one-half glass of wine (5 oz), or one shot of hard liquor (1 oz). Nutrition  Eat a healthy diet. This includes: ? Eating foods that are high in fiber, such as fresh fruits and vegetables, whole grains, and beans. ? Limiting foods that are high in fat and  processed sugars, such as fried or sweet foods. General instructions  Take over-the-counter and prescription medicines only as told by your health care provider.  Do not use any products that contain nicotine or tobacco, such as cigarettes and e-cigarettes. These can keep blood from reaching damaged nerves. If you need help quitting, ask your health care provider.  If you have diabetes, work closely with your health care provider to keep your blood sugar under control.  If you have numbness in your feet: ? Check every day for signs of injury or infection. Watch for redness, warmth, and swelling. ? Wear padded socks and comfortable shoes. These help protect your feet.  Keep all follow-up visits as told by your health care provider. This is important. Contact a health care provider if you:  Have paresthesia that gets worse or does not go away.  Have a burning or prickling feeling that gets worse when you walk.  Have pain, cramps, or dizziness.  Develop a rash. Get help right away if you:  Feel weak.  Have trouble walking or moving.  Have problems with speech, understanding, or vision.  Feel confused.  Cannot control your bladder or bowel movements.  Have numbness after an injury.  Develop new weakness in an arm or leg.  Faint. Summary  Paresthesia is an abnormal burning or prickling sensation that is usually felt in the hands, arms, legs, or feet. It may also occur  in other parts of the body.  Paresthesia may occur without any clear cause, or it may be caused by breathing too quickly (hyperventilation), pressure on a nerve, an underlying medical condition, side effects of a medication, nutritional deficiencies, or exposure to toxic chemicals.  If you have paresthesia that lasts a long time, you may need to be evaluated by your health care provider. This information is not intended to replace advice given to you by your health care provider. Make sure you discuss any  questions you have with your health care provider. Document Released: 09/02/2002 Document Revised: 09/21/2017 Document Reviewed: 09/21/2017 Elsevier Interactive Patient Education  2019 [employer].

## 2018-11-26 NOTE — Progress Notes (Signed)
Kayla Munoz is a 43 y.o. female who presents to [place] today for paresthesias.  Kayla Munoz notes a 1 to 2-week history of a numb tingly sensation occurring in her lower and upper extremities.  She notes this occurs at times during the day and at bedtime.  She notes it is not constant.  She notes symptoms do not associate with weakness or numbness or loss of function.  She denies fevers chills vomiting or diarrhea.  She denies significant neck swelling or trouble breathing.  She notes that her father is sick in the hospital for the last month.  She notes she is not been sleeping well.  Her father will be discharged soon however.  Additionally she was found to have vitamin D deficiency.  She is increased her vitamin D and is currently taking 1000 units daily.  Takes an adult multivitamin daily as well.    ROS:  As above  Exam:  BP (!) 143/80   Pulse 86   Ht 5\' 3"  (1.6 m)   Wt 196 lb (88.9 kg)   BMI 34.72 kg/m  Wt Readings from Last 5 Encounters:  11/26/18 196 lb (88.9 kg)  11/14/18 198 lb (89.8 kg)  10/26/18 180 lb (81.6 kg)  08/23/17 186 lb (84.4 kg)  07/11/17 191 lb (86.6 kg)   General: Well Developed, well nourished, and in no acute distress.  Neuro/Psych: Alert and oriented x3, extra-ocular muscles intact, able to move all 4 extremities, sensation grossly intact. Skin: Warm and dry, no rashes noted.  Respiratory: Not using accessory muscles, speaking in full sentences, trachea midline.  Cardiovascular: Pulses palpable, no extremity edema. Abdomen: Does not appear distended. MSK:  C-spine: Nontender to spinal midline normal cervical motion Upper extremity strength reflexes and sensation are equal normal throughout. Negative Tinel's test and negative Phalen's test wrists bilaterally.  L-spine: Nontender to spinal midline normal lumbar motion. Lower extremity strength is equal normal throughout with the exception of right hip  abduction which is slightly limited or 4+/5. Bilateral hip motion is normal.  Hips bilaterally are nontender. Hip strength intact except right hip abduction 4+/5 Lower extremity equal normal-appearing bilaterally without edema.  Pulses are intact bilateral lower extremities.  Neuro: Alert and oriented normal coordination balance and gait    Assessment and Plan: 43 y.o. female with  Paresthesias.  Unclear etiology.  Possibly due to increased anxiety and decreased quality sleep with recent hospitalization of her father.  Neurologic and musculoskeletal exam today relatively benign.  Plan for a bit of watchful waiting and recheck in the near future if not improved.  Additionally will optimize vitamin D consumption.  Plan to increase vitamin D to 5000 units daily for about 2 to 3 months and then back down to 1000 units daily.   PDMP not reviewed this encounter. No orders of the defined types were placed in this encounter.  No orders of the defined types were placed in this encounter.   Historical information moved to improve visibility of documentation.  Past Medical History:  Diagnosis Date  . Anemia   . Fibroids   . Vitamin D deficiency 11/26/2018   Past Surgical History:  Procedure Laterality Date  . UTERINE FIBROID SURGERY  04/2011   Social History   Tobacco Use  . Smoking status: Former Smoker    Packs/day: 0.50    Years: 12.00    Pack years: 6.00    Types: Cigarettes    Last attempt to quit: 05/19/2014  Years since quitting: 4.5  . Smokeless tobacco: Never Used  Substance Use Topics  . Alcohol use: Yes    Alcohol/week: 7.0 standard drinks    Types: 7 Standard drinks or equivalent per week   family history includes Diabetes in her father and mother; Heart disease in her mother; Hypertension in her father and mother; Kidney disease in her father; Parkinson's disease in her paternal uncle; Stomach cancer in her paternal uncle.  Medications: Current Outpatient  Medications  Medication Sig Dispense Refill  . cholecalciferol (VITAMIN D3) 25 MCG (1000 UT) tablet Take 1,000 Units by mouth daily.    Marland Kitchen ketoconazole (NIZORAL) 2 % shampoo Wash affected areas once a day 120 mL prn  . Levonorgestrel (SKYLA) 13.5 MG IUD by Intrauterine route.    . Multiple Vitamin (MULTIVITAMIN WITH MINERALS) TABS tablet Take 1 tablet by mouth daily.     No current facility-administered medications for this visit.    No Known Allergies    Discussed warning signs or symptoms. Please see discharge instructions. Patient expresses understanding.

## 2019-01-02 ENCOUNTER — Ambulatory Visit: Payer: BC Managed Care – PPO

## 2019-01-04 ENCOUNTER — Ambulatory Visit: Payer: BC Managed Care – PPO

## 2019-01-10 ENCOUNTER — Other Ambulatory Visit: Payer: Self-pay | Admitting: Family Medicine

## 2019-01-10 DIAGNOSIS — L309 Dermatitis, unspecified: Secondary | ICD-10-CM

## 2019-02-06 ENCOUNTER — Ambulatory Visit (INDEPENDENT_AMBULATORY_CARE_PROVIDER_SITE_OTHER): Payer: BC Managed Care – PPO

## 2019-02-06 ENCOUNTER — Other Ambulatory Visit: Payer: Self-pay

## 2019-02-06 DIAGNOSIS — Z1231 Encounter for screening mammogram for malignant neoplasm of breast: Secondary | ICD-10-CM

## 2019-03-18 ENCOUNTER — Encounter: Payer: Self-pay | Admitting: Family Medicine

## 2019-03-18 ENCOUNTER — Ambulatory Visit (INDEPENDENT_AMBULATORY_CARE_PROVIDER_SITE_OTHER): Payer: BC Managed Care – PPO | Admitting: Family Medicine

## 2019-03-18 ENCOUNTER — Other Ambulatory Visit (HOSPITAL_COMMUNITY)
Admission: RE | Admit: 2019-03-18 | Discharge: 2019-03-18 | Disposition: A | Discharge status: Home / Self Care | Payer: BC Managed Care – PPO | Source: Ambulatory Visit | Attending: Family Medicine | Admitting: Family Medicine

## 2019-03-18 VITALS — BP 102/64 | HR 82 | Ht 62.0 in | Wt 200.0 lb

## 2019-03-18 DIAGNOSIS — Z124 Encounter for screening for malignant neoplasm of cervix: Secondary | ICD-10-CM

## 2019-03-18 NOTE — Progress Notes (Signed)
Established Patient Office Visit  Subjective:  Patient ID: Kayla Munoz, female    DOB: January 01, 1976  Age: 43 y.o. MRN: [id number]  CC:  Chief Complaint  Patient presents with  . Gynecologic Exam    HPI Kayla Munoz presents for Pap smear only.  She did have a wellness exam in February but we did not do her Pap smear at that time.  She is here today for that. No specific concerns or complaints. She has an IUD in, [place].    Past Medical History:  Diagnosis Date  . Anemia   . Fibroids   . Vitamin D deficiency 11/26/2018    Past Surgical History:  Procedure Laterality Date  . UTERINE FIBROID SURGERY  04/2011    Family History  Problem Relation Age of Onset  . Diabetes Father   . Hypertension Father   . Kidney disease Father   . Diabetes Mother   . Heart disease Mother   . Hypertension Mother   . Parkinson's disease Paternal Uncle   . Stomach cancer Paternal Uncle   . Breast cancer Neg Hx   . Colon cancer Neg Hx   . Ovarian cancer Neg Hx     Social History   Socioeconomic History  . Marital status: Single    Spouse name: Not on file  . Number of children: Not on file  . Years of education: Not on file  . Highest education level: Not on file  Occupational History  . Not on file  Social Needs  . Financial resource strain: Not on file  . Food insecurity    Worry: Not on file    Inability: Not on file  . Transportation needs    Medical: Not on file    Non-medical: Not on file  Tobacco Use  . Smoking status: Former Smoker    Packs/day: 0.50    Years: 12.00    Pack years: 6.00    Types: Cigarettes    Quit date: 05/19/2014    Years since quitting: 4.8  . Smokeless tobacco: Never Used  Substance and Sexual Activity  . Alcohol use: Yes    Alcohol/week: 7.0 standard drinks    Types: 7 Standard drinks or equivalent per week  . Drug use: Yes    Types: Marijuana  . Sexual activity: Not Currently    Birth control/protection: I.U.D.  Lifestyle  . Physical  activity    Days per week: Not on file    Minutes per session: Not on file  . Stress: Not on file  Relationships  . Social Herbalist on phone: Not on file    Gets together: Not on file    Attends religious service: Not on file    Active member of club or organization: Not on file    Attends meetings of clubs or organizations: Not on file    Relationship status: Not on file  . Intimate partner violence    Fear of current or ex partner: Not on file    Emotionally abused: Not on file    Physically abused: Not on file    Forced sexual activity: Not on file  Other Topics Concern  . Not on file  Social History Narrative  . Not on file    Outpatient Medications Prior to Visit  Medication Sig Dispense Refill  . cholecalciferol (VITAMIN D3) 25 MCG (1000 UT) tablet Take 1,000 Units by mouth daily.    Marland Kitchen ketoconazole (NIZORAL) 2 % shampoo WASH AFFECTED AREAS  ONCE A DAY 120 mL PRN  . Levonorgestrel (SKYLA) 13.5 MG IUD by Intrauterine route.    . Multiple Vitamin (MULTIVITAMIN WITH MINERALS) TABS tablet Take 1 tablet by mouth daily.     No facility-administered medications prior to visit.     No Known Allergies  ROS Review of Systems    Objective:    Physical Exam  Constitutional: She is oriented to person, place, and time. She appears well-developed and well-nourished.  HENT:  Head: Normocephalic and atraumatic.  Eyes: Conjunctivae and EOM are normal.  Cardiovascular: Normal rate.  Pulmonary/Chest: Effort normal.  Genitourinary:    Vagina normal.  There is no rash on the right labia. There is no rash on the left labia. Uterus is enlarged. Uterus is not deviated, not fixed and not tender. Cervix exhibits no motion tenderness, no discharge and no friability. Right adnexum displays no mass and no fullness. Left adnexum displays no mass and no fullness.    No vaginal discharge, erythema, tenderness or bleeding.  No erythema, tenderness or bleeding in the vagina.    No  foreign body in the vagina.     No signs of injury in the vagina.   Neurological: She is alert and oriented to person, place, and time.  Skin: Skin is dry. No pallor.  Psychiatric: She has a normal mood and affect. Her behavior is normal.  Vitals reviewed.   BP 102/64   Pulse 82   Ht 5\' 2"  (1.575 m)   Wt 200 lb (90.7 kg)   SpO2 99%   BMI 36.58 kg/m  Wt Readings from Last 3 Encounters:  03/18/19 200 lb (90.7 kg)  11/26/18 196 lb (88.9 kg)  11/14/18 198 lb (89.8 kg)     Health Maintenance Due  Topic Date Due  . PAP SMEAR-Modifier  01/25/2019    There are no preventive care reminders to display for this patient.  Lab Results  Component Value Date   TSH 0.50 01/25/2016   Lab Results  Component Value Date   WBC 8.8 11/14/2018   HGB 13.7 11/14/2018   HCT 39.4 11/14/2018   MCV 95.6 11/14/2018   PLT 320 11/14/2018   Lab Results  Component Value Date   NA 139 11/14/2018   K 3.9 11/14/2018   CO2 28 11/14/2018   GLUCOSE 92 11/14/2018   BUN 10 11/14/2018   CREATININE 0.78 11/14/2018   BILITOT 0.4 11/14/2018   ALKPHOS 35 06/06/2016   AST 14 11/14/2018   ALT 11 11/14/2018   PROT 6.8 11/14/2018   ALBUMIN 3.9 06/06/2016   CALCIUM 9.3 11/14/2018   Lab Results  Component Value Date   CHOL 142 11/14/2018   Lab Results  Component Value Date   HDL 53 11/14/2018   Lab Results  Component Value Date   LDLCALC 78 11/14/2018   Lab Results  Component Value Date   TRIG 37 11/14/2018   Lab Results  Component Value Date   CHOLHDL 2.7 11/14/2018   Lab Results  Component Value Date   HGBA1C 5.3 06/06/2016      Assessment & Plan:   Problem List Items Addressed This Visit    None    Visit Diagnoses    Screening for cervical cancer    -  Primary   Relevant Orders   Cytology - PAP     Will call with pap results.  I didn't see the string on her IUD.     No orders of the defined types were placed in  this encounter.   Follow-up: Return if symptoms worsen  or fail to improve.    Beatrice Lecher, MD

## 2019-03-20 LAB — CYTOLOGY - PAP
Diagnosis: NEGATIVE
HPV: NOT DETECTED

## 2019-03-20 NOTE — Progress Notes (Signed)
Call patient: Your Pap smear is normal. Repeat in 5 years.

## 2019-09-30 ENCOUNTER — Ambulatory Visit (INDEPENDENT_AMBULATORY_CARE_PROVIDER_SITE_OTHER): Payer: BC Managed Care – PPO | Admitting: Physician Assistant

## 2019-09-30 ENCOUNTER — Encounter: Payer: Self-pay | Admitting: Physician Assistant

## 2019-09-30 VITALS — BP 130/83 | HR 78 | Wt 192.0 lb

## 2019-09-30 DIAGNOSIS — R202 Paresthesia of skin: Secondary | ICD-10-CM | POA: Diagnosis not present

## 2019-09-30 DIAGNOSIS — R42 Dizziness and giddiness: Secondary | ICD-10-CM

## 2019-09-30 DIAGNOSIS — R030 Elevated blood-pressure reading, without diagnosis of hypertension: Secondary | ICD-10-CM | POA: Diagnosis not present

## 2019-09-30 DIAGNOSIS — R9089 Other abnormal findings on diagnostic imaging of central nervous system: Secondary | ICD-10-CM

## 2019-09-30 DIAGNOSIS — R519 Headache, unspecified: Secondary | ICD-10-CM

## 2019-09-30 NOTE — Patient Instructions (Addendum)
MRI Brain - you will be contacted about scheduling tomorrow Labs tomorrow (do not need to fast) BP monitoring - twice daily for 2 weeks  How to Take Your Blood Pressure Blood pressure is a measurement of how strongly your blood is pressing against the walls of your arteries. Arteries are blood vessels that carry blood from your heart throughout your body. Your health care provider takes your blood pressure at each office visit. You can also take your own blood pressure at home with a blood pressure machine. You may need to take your own blood pressure:  To confirm a diagnosis of high blood pressure (hypertension).  To monitor your blood pressure over time.  To make sure your blood pressure medicine is working. Supplies needed: To take your blood pressure, you will need a blood pressure machine. You can buy a blood pressure machine, or blood pressure monitor, at most drugstores or online. There are several types of home blood pressure monitors. When choosing one, consider the following:  Choose a monitor that has an arm cuff.  Choose a cuff that wraps snugly around your upper arm. You should be able to fit only one finger between your arm and the cuff.  Do not choose a monitor that measures your blood pressure from your wrist or finger. Your health care provider can suggest a reliable monitor that will meet your needs. How to prepare To get the most accurate reading, avoid the following for 30 minutes before you check your blood pressure:  Drinking caffeine.  Drinking alcohol.  Eating.  Smoking.  Exercising. Five minutes before you check your blood pressure:  Empty your bladder.  Sit quietly without talking in a dining chair, rather than in a soft couch or armchair. How to take your blood pressure To check your blood pressure, follow the instructions in the manual that came with your blood pressure monitor. If you have a digital blood pressure monitor, the instructions may be as  follows: 1. Sit up straight. 2. Place your feet on the floor. Do not cross your ankles or legs. 3. Rest your left arm at the level of your heart on a table or desk or on the arm of a chair. 4. Pull up your shirt sleeve. 5. Wrap the blood pressure cuff around the upper part of your left arm, 1 inch (2.5 cm) above your elbow. It is best to wrap the cuff around bare skin. 6. Fit the cuff snugly around your arm. You should be able to place only one finger between the cuff and your arm. 7. Position the cord inside the groove of your elbow. 8. Press the power button. 9. Sit quietly while the cuff inflates and deflates. 10. Read the digital reading on the monitor screen and write it down (record it). 11. Wait 2-3 minutes, then repeat the steps, starting at step 1. What does my blood pressure reading mean? A blood pressure reading consists of a higher number over a lower number. Ideally, your blood pressure should be below 120/80. The first ("top") number is called the systolic pressure. It is a measure of the pressure in your arteries as your heart beats. The second ("bottom") number is called the diastolic pressure. It is a measure of the pressure in your arteries as the heart relaxes. Blood pressure is classified into four stages. The following are the stages for adults who do not have a short-term serious illness or a chronic condition. Systolic pressure and diastolic pressure are measured in a unit  called mm Hg. Normal  Systolic pressure: below 123456.  Diastolic pressure: below 80. Elevated  Systolic pressure: [id number].  Diastolic pressure: below 80. Hypertension stage 1  Systolic pressure: 0000000.  Diastolic pressure: [id number]. Hypertension stage 2  Systolic pressure: [id number] or above.  Diastolic pressure: 90 or above. You can have prehypertension or hypertension even if only the systolic or only the diastolic number in your reading is higher than normal. Follow these instructions at  home:  Check your blood pressure as often as recommended by your health care provider.  Take your monitor to the next appointment with your health care provider to make sure: ? That you are using it correctly. ? That it provides accurate readings.  Be sure you understand what your goal blood pressure numbers are.  Tell your health care provider if you are having any side effects from blood pressure medicine. Contact a health care provider if:  Your blood pressure is consistently high. Get help right away if:  Your systolic blood pressure is higher than 180.  Your diastolic blood pressure is higher than 110. This information is not intended to replace advice given to you by your health care provider. Make sure you discuss any questions you have with your health care provider. Document Revised: 08/25/2017 Document Reviewed: 02/19/2016 Elsevier Patient Education  2020 [employer].

## 2019-09-30 NOTE — Progress Notes (Signed)
HPI:                                                                Kayla Munoz is a 44 y.o. female who presents to [place] today for headaches/dizziness  Pleasant 39 YOF with PMH of anxiety, anemia, chronic peripheral edema, vit d insufficiency presents with 2-3 weeks of new onset dizziness and throbbing headaches. Dizziness is triggered by quick head movements and bending forward and described as feeling "wonky" and like she needs new glasses. Headaches are described as brief, throbbing pains that migrate in the occipital, parietal and temporal areas. Associated with mild photophobia. Denies vision changes/visual disturbance. She has also been experiencing intermittent paresthesias of her lower legs/feet, arms and hands bilaterally. Denies hx of vertigo, migraines or headache disorder. Denies gait disturbance or falls. Denies head injury/trauma. Denies hx of HTN. Reports checking home BP once and it was elevated but does not recall the reading.  Endorses anxiety that is unchanged. Admits she was smoking THC 1-2 times daily for the last 2-3 years, but recently quit cold [place] approx 6 weeks ago.   Past Medical History:  Diagnosis Date  . Anemia   . Fibroids   . Vitamin D deficiency 11/26/2018   Past Surgical History:  Procedure Laterality Date  . UTERINE FIBROID SURGERY  04/2011   Social History   Tobacco Use  . Smoking status: Former Smoker    Packs/day: 0.50    Years: 12.00    Pack years: 6.00    Types: Cigarettes    Quit date: 05/19/2014    Years since quitting: 5.3  . Smokeless tobacco: Never Used  Substance Use Topics  . Alcohol use: Yes    Alcohol/week: 7.0 standard drinks    Types: 7 Standard drinks or equivalent per week   family history includes Diabetes in her father and mother; Heart disease in her mother; Hypertension in her father and mother; Kidney disease in her father; Parkinson's disease in her paternal  uncle; Stomach cancer in her paternal uncle.    Review of Systems  Constitutional: Negative.   HENT: Negative for congestion, ear pain, hearing loss, sinus pain and tinnitus.   Eyes: Positive for photophobia. Negative for blurred vision and double vision.  Respiratory: Negative.   Cardiovascular: Positive for leg swelling (chronic, b/l, unchanged). Negative for chest pain, palpitations and claudication.  Gastrointestinal: Negative for nausea and vomiting.  Musculoskeletal: Positive for back pain.  Neurological: Positive for dizziness, tingling, sensory change and headaches. Negative for focal weakness and loss of consciousness.  Psychiatric/Behavioral: Negative for depression. The patient is nervous/anxious.   All other systems reviewed and are negative.    Medications: Current Outpatient Medications  Medication Sig Dispense Refill  . Levonorgestrel (SKYLA) 13.5 MG IUD by Intrauterine route.     No current facility-administered medications for this visit.   No Known Allergies     Objective:  BP 130/83   Pulse 78   Wt 192 lb (87.1 kg)   SpO2 97%   BMI 35.12 kg/m   Vitals:   09/30/19 1536 09/30/19 2257  BP: (!) 156/93 130/83  Pulse: 90 78  SpO2: 97%     Wt Readings from Last 3 Encounters:  09/30/19 192 lb (87.1  kg)  03/18/19 200 lb (90.7 kg)  11/26/18 196 lb (88.9 kg)   Temp Readings from Last 3 Encounters:  10/26/18 (!) 97.5 F (36.4 C) (Oral)  07/28/14 98.3 F (36.8 C) (Oral)   BP Readings from Last 3 Encounters:  09/30/19 130/83  03/18/19 102/64  11/26/18 (!) 143/80   Pulse Readings from Last 3 Encounters:  09/30/19 78  03/18/19 82  11/26/18 86   Gen: well-groomed, not ill-appearing, no acute distress, obese female HEENT: head normocephalic, atraumatic; conjunctiva and cornea clear, oropharynx clear, moist mucus membranes; neck supple, no meningeal signs Pulm: Normal work of breathing, normal phonation, clear to auscultation bilaterally CV: Normal  rate, regular rhythm, s1 and s2 distinct, no murmurs, clicks or rubs Neuro:  cranial nerves II-XII intact, no nystagmus, normal finger-to-nose, normal heel-to-shin, negative pronator drift, normal rapid alternating movements, DTR's intact, normal tone, no tremor, negative HIINTS MSK: strength 5/5 and symmetric in bilateral upper and lower extremities, normal gait and station, negative Romberg Mental Status: alert and oriented x 3, speech articulate, euthymic mood, anxious affect, thought processes clear and goal-directed    No results found for this or any previous visit (from the past 72 hour(s)). No results found.    Assessment and Plan: 44 y.o. female with   .Kayla Munoz was seen today for hypertension.  Diagnoses and all orders for this visit:  Paresthesias -     MR Brain W Wo Contrast -     B12 and Folate Panel -     RPR -     C-reactive protein -     ESR -     CBC with Differential/Platelet -     CMP w/GFR -     TSH + free T4  Nonintractable episodic headache, unspecified headache type -     MR Brain W Wo Contrast -     B12 and Folate Panel -     RPR -     C-reactive protein -     ESR -     CBC with Differential/Platelet -     CMP w/GFR -     TSH + free T4  Dizziness -     MR Brain W Wo Contrast -     B12 and Folate Panel -     RPR -     C-reactive protein -     ESR -     CBC with Differential/Platelet -     CMP w/GFR -     TSH + free T4  Elevated blood pressure reading   1. New onset paroxysmal dizziness, paresthesias, and headaches Reassuring neuro exam DDx includes BPPV, anxiety, B12 deficiency and less likely vestibular migraine, MS, posterior fossa stroke Will obtain MRI brain given new onset of headaches with no prior hx of primary headache disorder and neurologic deficits of paresthesia and dizziness Labs including ESR, CRP, RPR, B12 pending (see above)  Elevated BP reading - no prior hx of HTN. Re-check in stage 1 hypertensive range. Unlikely the  cause of this constellation of symptoms, but may exacerbate dizziness - recommend home BP monitoring x 2 weeks - counseled on therapeutic lifestyle changes  Patient education and anticipatory guidance given Patient agrees with treatment plan Follow-up with PCP in 2 weeks or sooner as needed if symptoms worsen or fail to improve  Darlyne Russian PA-C

## 2019-10-01 ENCOUNTER — Ambulatory Visit (INDEPENDENT_AMBULATORY_CARE_PROVIDER_SITE_OTHER): Payer: BC Managed Care – PPO

## 2019-10-01 ENCOUNTER — Other Ambulatory Visit: Payer: Self-pay

## 2019-10-01 DIAGNOSIS — R519 Headache, unspecified: Secondary | ICD-10-CM

## 2019-10-01 DIAGNOSIS — R42 Dizziness and giddiness: Secondary | ICD-10-CM

## 2019-10-01 DIAGNOSIS — R202 Paresthesia of skin: Secondary | ICD-10-CM | POA: Diagnosis not present

## 2019-10-01 MED ORDER — GADOBUTROL 1 MMOL/ML IV SOLN
8.5000 mL | Freq: Once | INTRAVENOUS | Status: AC | PRN
  Administered 2019-10-01: 8.5 mL via INTRAVENOUS

## 2019-10-01 NOTE — Progress Notes (Signed)
Called and spoke with patient regarding abnormal MRI findings and referral to Neurology Questions answered Patient in agreement with treatment plan Referral placed to [facility]

## 2019-10-02 LAB — COMPLETE METABOLIC PANEL WITH GFR
AG Ratio: 1.5 (calc) (ref 1.0–2.5)
ALT: 10 U/L (ref 6–29)
AST: 14 U/L (ref 10–30)
Albumin: 4.5 g/dL (ref 3.6–5.1)
Alkaline phosphatase (APISO): 53 U/L (ref 31–125)
BUN: 10 mg/dL (ref 7–25)
CO2: 28 mmol/L (ref 20–32)
Calcium: 9.8 mg/dL (ref 8.6–10.2)
Chloride: 104 mmol/L (ref 98–110)
Creat: 0.8 mg/dL (ref 0.50–1.10)
GFR, Est African American: 106 mL/min/{1.73_m2} (ref >=60)
GFR, Est Non African American: 92 mL/min/{1.73_m2} (ref >=60)
Globulin: 3 g/dL (calc) (ref 1.9–3.7)
Glucose, Bld: 94 mg/dL (ref 65–99)
Potassium: 3.9 mmol/L (ref 3.5–5.3)
Sodium: 141 mmol/L (ref 135–146)
Total Bilirubin: 0.4 mg/dL (ref 0.2–1.2)
Total Protein: 7.5 g/dL (ref 6.1–8.1)

## 2019-10-02 LAB — CBC WITH DIFFERENTIAL/PLATELET
Absolute Monocytes: 550 cells/uL (ref 200–950)
Basophils Absolute: 52 cells/uL (ref 0–200)
Basophils Relative: 0.6 %
Eosinophils Absolute: 69 cells/uL (ref 15–500)
Eosinophils Relative: 0.8 %
HCT: 44.6 % (ref 35.0–45.0)
Hemoglobin: 14.9 g/dL (ref 11.7–15.5)
Lymphs Abs: 2167 cells/uL (ref 850–3900)
MCH: 32.7 pg (ref 27.0–33.0)
MCHC: 33.4 g/dL (ref 32.0–36.0)
MCV: 98 fL (ref 80.0–100.0)
MPV: 10.2 fL (ref 7.5–12.5)
Monocytes Relative: 6.4 %
Neutro Abs: 5762 cells/uL (ref 1500–7800)
Neutrophils Relative %: 67 %
Platelets: 320 10*3/uL (ref 140–400)
RBC: 4.55 10*6/uL (ref 3.80–5.10)
RDW: 13.6 % (ref 11.0–15.0)
Total Lymphocyte: 25.2 %
WBC: 8.6 10*3/uL (ref 3.8–10.8)

## 2019-10-02 LAB — TSH+FREE T4: TSH W/REFLEX TO FT4: 0.62 mIU/L

## 2019-10-02 LAB — SEDIMENTATION RATE: Sed Rate: 9 mm/h (ref 0–20)

## 2019-10-02 LAB — B12 AND FOLATE PANEL
Folate: 13.3 ng/mL
Vitamin B-12: 602 pg/mL (ref 200–1100)

## 2019-10-02 LAB — RPR: RPR Ser Ql: NONREACTIVE

## 2019-10-02 LAB — C-REACTIVE PROTEIN: CRP: 1.4 mg/L (ref <8.0)

## 2019-10-02 NOTE — Progress Notes (Signed)
NEUROLOGY CONSULTATION NOTE  Kayla Munoz MRN: [id number] DOB: 08-13-1976  Referring provider: Trixie Dredge, PA-C Primary care provider: Beatrice Lecher, MD  Reason for consult:  Dizziness, headache, paresthesias, abnormal brain MRI  HISTORY OF PRESENT ILLNESS: Kayla Munoz is a 44 year old right-handed black female who presents for above.  History supplemented by referring provider note.  For the past 2 weeks, she developed dizziness and bilateral retro-orbital pain.  The dizziness is described as a brief lightheadedness when she would bend over or change positions.  In addition, she would note brief areas of pain on various parts of her head.  It is not severe or debilitating.  She denies blurred vision, visual obscurations.  She denies pulsatile tinnitus.  She denies recent weight gain.  She also endorses paresthesias mostly in the hands and arms but sometimes in the legs.She has endorsed paresthesias last year as well.  Neurologic and musculoskeletal exam at that time was reportedly unremarkable.  She was found to have low vitamin D and was started on supplementation.  Anxiety was thought to possibly be a contributing factor.  MRI of brain with and without contrast performed on 09/30/2019 was personally reviewed and showed mild prominence of sella turcica and prominent CSF ventral to brainstem but otherwise unremarkable.  Labs from 10/01/2019 were unremarkable, including CBC, CMP, B12 602, TSH 0.62, ESR 9, CRP 1.4, non-reactive RPR.  About 4 to 6 weeks ago, she quit smoking marijuana.  Over the past few weeks, her blood pressure has been elevated as well.  PAST MEDICAL HISTORY: Past Medical History:  Diagnosis Date  . Anemia   . Fibroids   . Vitamin D deficiency 11/26/2018    PAST SURGICAL HISTORY: Past Surgical History:  Procedure Laterality Date  . UTERINE FIBROID SURGERY  04/2011    MEDICATIONS: Current Outpatient Medications on File Prior to Visit   Medication Sig Dispense Refill  . Levonorgestrel (SKYLA) 13.5 MG IUD by Intrauterine route.     No current facility-administered medications on file prior to visit.    ALLERGIES: No Known Allergies  FAMILY HISTORY: Family History  Problem Relation Age of Onset  . Diabetes Father   . Hypertension Father   . Kidney disease Father   . Diabetes Mother   . Heart disease Mother   . Hypertension Mother   . Parkinson's disease Paternal Uncle   . Stomach cancer Paternal Uncle   . Breast cancer Neg Hx   . Colon cancer Neg Hx   . Ovarian cancer Neg Hx     SOCIAL HISTORY: Social History   Socioeconomic History  . Marital status: Single    Spouse name: Not on file  . Number of children: Not on file  . Years of education: Not on file  . Highest education level: Not on file  Occupational History  . Not on file  Tobacco Use  . Smoking status: Former Smoker    Packs/day: 0.50    Years: 12.00    Pack years: 6.00    Types: Cigarettes    Quit date: 05/19/2014    Years since quitting: 5.3  . Smokeless tobacco: Never Used  Substance and Sexual Activity  . Alcohol use: Yes    Alcohol/week: 7.0 standard drinks    Types: 7 Standard drinks or equivalent per week  . Drug use: Not Currently    Types: Marijuana    Comment: quit cold [place] 07/2019, previously smoked 1-2 times daily  . Sexual activity: Not Currently  Birth control/protection: I.U.D.  Other Topics Concern  . Not on file  Social History Narrative  . Not on file   Social Determinants of Health   Financial Resource Strain:   . Difficulty of Paying Living Expenses: Not on file  Food Insecurity:   . Worried About Charity fundraiser in the Last Year: Not on file  . Ran Out of Food in the Last Year: Not on file  Transportation Needs:   . Lack of Transportation (Medical): Not on file  . Lack of Transportation (Non-Medical): Not on file  Physical Activity:   . Days of Exercise per Week: Not on file  . Minutes of  Exercise per Session: Not on file  Stress:   . Feeling of Stress : Not on file  Social Connections:   . Frequency of Communication with Friends and Family: Not on file  . Frequency of Social Gatherings with Friends and Family: Not on file  . Attends Religious Services: Not on file  . Active Member of Clubs or Organizations: Not on file  . Attends Archivist Meetings: Not on file  . Marital Status: Not on file  Intimate Partner Violence:   . Fear of Current or Ex-Partner: Not on file  . Emotionally Abused: Not on file  . Physically Abused: Not on file  . Sexually Abused: Not on file    REVIEW OF SYSTEMS: Constitutional: No fevers, chills, or sweats, no generalized fatigue, change in appetite Eyes: No visual changes, double vision, eye pain Ear, nose and throat: No hearing loss, ear pain, nasal congestion, sore throat Cardiovascular: No chest pain, palpitations Respiratory:  No shortness of breath at rest or with exertion, wheezes GastrointestinaI: No nausea, vomiting, diarrhea, abdominal pain, fecal incontinence Genitourinary:  No dysuria, urinary retention or frequency Musculoskeletal:  No neck pain, back pain Integumentary: No rash, pruritus, skin lesions Neurological: as above Psychiatric: No depression, insomnia, anxiety Endocrine: No palpitations, fatigue, diaphoresis, mood swings, change in appetite, change in weight, increased thirst Hematologic/Lymphatic:  No purpura, petechiae. Allergic/Immunologic: no itchy/runny eyes, nasal congestion, recent allergic reactions, rashes  PHYSICAL EXAM: Blood pressure (!) 149/90, pulse 98, height '5\' 3"'$  (1.6 m), weight 190 lb (86.2 kg), SpO2 95 %. General: No acute distress.  Patient appears well-groomed.  Head:  Normocephalic/atraumatic Eyes:  fundi examined but not visualized Neck: supple, no paraspinal tenderness, full range of motion Back: No paraspinal tenderness Heart: regular rate and rhythm Lungs: Clear to  auscultation bilaterally. Vascular: No carotid bruits. Neurological Exam: Mental status: alert and oriented to person, place, and time, recent and remote memory intact, fund of knowledge intact, attention and concentration intact, speech fluent and not dysarthric, language intact. Cranial nerves: CN I: not tested CN II: pupils equal, round and reactive to light, visual fields intact CN III, IV, VI:  full range of motion, no nystagmus, no ptosis CN V: facial sensation intact CN VII: upper and lower face symmetric CN VIII: hearing intact CN IX, X: gag intact, uvula midline CN XI: sternocleidomastoid and trapezius muscles intact CN XII: tongue midline Bulk & Tone: normal, no fasciculations. Motor:  5/5 throughout  Sensation: temperature and vibration sensation intact. Deep Tendon Reflexes:  2+ throughout, toes downgoing.  Finger to nose testing:  Without dysmetria.  Heel to shin:  Without dysmetria.  Gait:  Normal station and stride.  Able to turn.  Romberg negative.  IMPRESSION: 1.  Empty sella turcica.  Often of no clinical significance but may suggest increased intracranial hypertension.  2.  Unspecified headaches.   3.  Positional dizziness.  Does not sound like a central neurologic etiology 4.  Paresthesias.  Labs unremarkable. 5.  Elevated blood pressure reading.  PLAN: 1.  Refer to Dr. Marshall Cork at [facility] for evaluation of papilledema.  Further recommendations pending results.  Regardless of findings, we may still consider lumbar puncture for evaluation of opening pressure.  However, in absence of physiologic papilledema, I am not convinced that her symptoms are related to intracranial hypertension. 2.  Regarding paresthesias, suggested NCV-EMG.  She defers at this time. 3.  No treatment for headaches at this time, as improving.  4.  Advised to follow up with PCP regarding elevated blood pressure.  Thank you for allowing me to take part in the care of  this patient.  Metta Clines, DO  CC:  Beatrice Lecher, MD  Nelson Chimes, PA-C

## 2019-10-03 ENCOUNTER — Ambulatory Visit: Payer: BC Managed Care – PPO | Admitting: Neurology

## 2019-10-03 ENCOUNTER — Other Ambulatory Visit: Payer: Self-pay

## 2019-10-03 ENCOUNTER — Encounter: Payer: Self-pay | Admitting: Neurology

## 2019-10-03 VITALS — BP 149/90 | HR 98 | Ht 63.0 in | Wt 190.0 lb

## 2019-10-03 DIAGNOSIS — E236 Other disorders of pituitary gland: Secondary | ICD-10-CM | POA: Diagnosis not present

## 2019-10-03 DIAGNOSIS — R519 Headache, unspecified: Secondary | ICD-10-CM | POA: Diagnosis not present

## 2019-10-03 DIAGNOSIS — R202 Paresthesia of skin: Secondary | ICD-10-CM | POA: Diagnosis not present

## 2019-10-03 DIAGNOSIS — R030 Elevated blood-pressure reading, without diagnosis of hypertension: Secondary | ICD-10-CM

## 2019-10-03 NOTE — Patient Instructions (Signed)
1.  I will refer you to Dr. Marshall Cork at [facility] for formal eye exam to look for evidence of increased pressure behind the eyes.  2.  Further recommendations pending results.

## 2019-10-09 ENCOUNTER — Telehealth: Payer: Self-pay | Admitting: Neurology

## 2019-10-09 NOTE — Telephone Encounter (Signed)
Patient states that we referred her to a eye Dr and he is out of her network. She would like a referral to someone in her network

## 2019-10-09 NOTE — Telephone Encounter (Signed)
Could you give another referral to another eye dr. Please advise

## 2019-10-10 NOTE — Telephone Encounter (Signed)
Called back and left message to call office back

## 2019-10-10 NOTE — Telephone Encounter (Signed)
I would like torefer to Dr. Tama High at [facility]

## 2019-10-10 NOTE — Telephone Encounter (Signed)
Sent information and images , notes etc to [facility]

## 2019-10-10 NOTE — Telephone Encounter (Signed)
[facility] called regarding a referral they received on this patient. They were not clear on the Diagnosis. Also, she said they have a Dr. Marshall Cork at that office. Please Call to confirm the patient's dx as well as the Doctor. Thank you

## 2019-10-15 ENCOUNTER — Other Ambulatory Visit: Payer: Self-pay

## 2019-10-15 ENCOUNTER — Encounter: Payer: Self-pay | Admitting: Family Medicine

## 2019-10-15 ENCOUNTER — Ambulatory Visit (INDEPENDENT_AMBULATORY_CARE_PROVIDER_SITE_OTHER): Payer: BC Managed Care – PPO | Admitting: Family Medicine

## 2019-10-15 VITALS — BP 120/81 | HR 77 | Ht 63.0 in | Wt 191.0 lb

## 2019-10-15 DIAGNOSIS — I10 Essential (primary) hypertension: Secondary | ICD-10-CM | POA: Diagnosis not present

## 2019-10-15 DIAGNOSIS — Z87891 Personal history of nicotine dependence: Secondary | ICD-10-CM

## 2019-10-15 DIAGNOSIS — R519 Headache, unspecified: Secondary | ICD-10-CM

## 2019-10-15 DIAGNOSIS — R030 Elevated blood-pressure reading, without diagnosis of hypertension: Secondary | ICD-10-CM | POA: Insufficient documentation

## 2019-10-15 HISTORY — DX: Essential (primary) hypertension: I10

## 2019-10-15 MED ORDER — LISINOPRIL 5 MG PO TABS: 5.0000 mg | ORAL_TABLET | Freq: Every day | ORAL | 0 refills | Status: DC

## 2019-10-15 NOTE — Patient Instructions (Signed)
Send me a copy of your BPS in about 2 to 3 weeks.  If you are tolerating the medication well then we will also need to do a blood test in about 1 week.  You can just go to the lab.  You do not need to fast.  I like to see back in about 3 months we can just make sure that we are on track.

## 2019-10-15 NOTE — Progress Notes (Addendum)
Established Patient Office Visit  Subjective:  Patient ID: Kayla Munoz, female    DOB: November 24, 1975  Age: 44 y.o. MRN: [id number]  CC:  Chief Complaint  Patient presents with  . Follow-up    HPI Kayla Munoz presents for 2-week follow-up.  She actually saw one of my partners on January 4 for new onset dizziness and throbbing headaches.  She felt like it was triggered by quick movements and bending forward.  Previously reported that the headaches were brief and throbbing and would migrate from the subdural to the parietal and temporal areas.  It was associated with mild photophobia but no visual changes.  She was also having some intermittent paresthesias in her legs and feet and arms and hands bilaterally.  No prior history of vertigo or migraines or headache disorder.  Because of the unusual nature of her symptoms she was scheduled for brain MRI which showed a mild prominence of the sella turcica as well as prominent CSF ventral to the brainstem.  It was nonspecific but sometimes could be seen with increased intracranial pressure and so she was referred to neurology.  She was also noted to have some mild maxillary sinus disease.  Labs including CBC, CMP, B12, TSH sed rate and RPR were unrevealing.  She was then referred to Dr. Minette Brine at [facility] ophthalmology for evaluation for papilledema and they did discuss possible lumbar puncture after that.  They also recommended possible nerve conduction study/EMG for evaluation of the paresthesias in the extremities.  Headaches seem to be improving at that time so no additional treatment was offered.  Was advised to follow-up with PCP for the elevated blood pressures.  Headaches-she feels like some of it could be musculoskeletal and actually coming from her neck.  She feels like since really making some adjustments to her posture and sleep position at night it has made a big difference and seems to be helping.  She also feels like when she is  not wearing good support for her breast that it also affects the pain in her neck as well as may be been triggering some of her headaches.  She has not been exercising as regularly and has gained some weight since Covid started.  She had also been experiencing some constipation body aches and lightheadedness, but again feels like a lot of that is better.  Reports home blood pressures have been running in the 150-1 60 range over the 90s.  In the last week she has started some exercise.  A couple months ago she actually quit smoking cigarettes as well as THC and read online that some of the things that she had been experiencing might be related to stopping tobacco and THC.  Past Medical History:  Diagnosis Date  . Anemia   . Essential hypertension 10/15/2019  . Fibroids   . Vitamin D deficiency 11/26/2018    Past Surgical History:  Procedure Laterality Date  . UTERINE FIBROID SURGERY  04/2011    Family History  Problem Relation Age of Onset  . Diabetes Father   . Hypertension Father   . Kidney disease Father   . Diabetes Mother   . Heart disease Mother   . Hypertension Mother   . Parkinson's disease Paternal Uncle   . Stomach cancer Paternal Uncle   . Breast cancer Neg Hx   . Colon cancer Neg Hx   . Ovarian cancer Neg Hx     Social History   Socioeconomic History  . Marital status: Single  Spouse name: Not on file  . Number of children: Not on file  . Years of education: Not on file  . Highest education level: Not on file  Occupational History  . Not on file  Tobacco Use  . Smoking status: Former Smoker    Packs/day: 0.50    Years: 12.00    Pack years: 6.00    Types: Cigarettes    Quit date: 05/19/2014    Years since quitting: 5.4  . Smokeless tobacco: Never Used  Substance and Sexual Activity  . Alcohol use: Yes    Alcohol/week: 7.0 standard drinks    Types: 7 Standard drinks or equivalent per week  . Drug use: Not Currently    Types: Marijuana    Comment: quit  cold [place] 07/2019, previously smoked 1-2 times daily  . Sexual activity: Not Currently    Birth control/protection: I.U.D.  Other Topics Concern  . Not on file  Social History Narrative   Right handed   Lives in one story house    Drinks caffeine   Social Determinants of Health   Financial Resource Strain:   . Difficulty of Paying Living Expenses: Not on file  Food Insecurity:   . Worried About Charity fundraiser in the Last Year: Not on file  . Ran Out of Food in the Last Year: Not on file  Transportation Needs:   . Lack of Transportation (Medical): Not on file  . Lack of Transportation (Non-Medical): Not on file  Physical Activity:   . Days of Exercise per Week: Not on file  . Minutes of Exercise per Session: Not on file  Stress:   . Feeling of Stress : Not on file  Social Connections:   . Frequency of Communication with Friends and Family: Not on file  . Frequency of Social Gatherings with Friends and Family: Not on file  . Attends Religious Services: Not on file  . Active Member of Clubs or Organizations: Not on file  . Attends Archivist Meetings: Not on file  . Marital Status: Not on file  Intimate Partner Violence:   . Fear of Current or Ex-Partner: Not on file  . Emotionally Abused: Not on file  . Physically Abused: Not on file  . Sexually Abused: Not on file    Outpatient Medications Prior to Visit  Medication Sig Dispense Refill  . Levonorgestrel (SKYLA) 13.5 MG IUD by Intrauterine route.     No facility-administered medications prior to visit.    No Known Allergies  ROS Review of Systems    Objective:    Physical Exam  Constitutional: She is oriented to person, place, and time. She appears well-developed and well-nourished.  HENT:  Head: Normocephalic and atraumatic.  Eyes: Conjunctivae and EOM are normal.  Neck: No thyromegaly present.  Cardiovascular: Normal rate, regular rhythm and normal heart sounds.  Pulmonary/Chest: Effort  normal and breath sounds normal.  Musculoskeletal:     Cervical back: Neck supple.  Neurological: She is alert and oriented to person, place, and time.  Skin: Skin is warm and dry.  Psychiatric: She has a normal mood and affect. Her behavior is normal.    BP 120/81   Pulse 77   Ht 5\' 3"  (1.6 m)   Wt 191 lb (86.6 kg)   SpO2 100%   BMI 33.83 kg/m  Wt Readings from Last 3 Encounters:  10/15/19 191 lb (86.6 kg)  10/03/19 190 lb (86.2 kg)  09/30/19 192 lb (87.1 kg)  There are no preventive care reminders to display for this patient.  There are no preventive care reminders to display for this patient.  Lab Results  Component Value Date   TSH 0.50 01/25/2016   Lab Results  Component Value Date   WBC 8.6 10/01/2019   HGB 14.9 10/01/2019   HCT 44.6 10/01/2019   MCV 98.0 10/01/2019   PLT 320 10/01/2019   Lab Results  Component Value Date   NA 141 10/01/2019   K 3.9 10/01/2019   CO2 28 10/01/2019   GLUCOSE 94 10/01/2019   BUN 10 10/01/2019   CREATININE 0.80 10/01/2019   BILITOT 0.4 10/01/2019   ALKPHOS 35 06/06/2016   AST 14 10/01/2019   ALT 10 10/01/2019   PROT 7.5 10/01/2019   ALBUMIN 3.9 06/06/2016   CALCIUM 9.8 10/01/2019   Lab Results  Component Value Date   CHOL 142 11/14/2018   Lab Results  Component Value Date   HDL 53 11/14/2018   Lab Results  Component Value Date   LDLCALC 78 11/14/2018   Lab Results  Component Value Date   TRIG 37 11/14/2018   Lab Results  Component Value Date   CHOLHDL 2.7 11/14/2018   Lab Results  Component Value Date   HGBA1C 5.3 06/06/2016      Assessment & Plan:   Problem List Items Addressed This Visit      Cardiovascular and Mediastinum   Essential hypertension    Home blood pressures have been running consistently high.  So we discussed options including maybe starting a low-dose of lisinopril and over the next several months working on trying to get back into her routine with regular exercise, low-salt  diet and losing some weight.  She admits she has gained some since Covid.  If at that point blood pressures are coming down nicely then we can always discontinue the lisinopril.  She will need to go for BMP in about 1 week.      Relevant Medications   lisinopril (ZESTRIL) 5 MG tablet   Other Relevant Orders   BASIC METABOLIC PANEL WITH GFR    Other Visit Diagnoses    Nonintractable episodic headache, unspecified headache type    -  Primary   Stopped smoking between 1 and 3 months ago         Headaches-she feels like some of it could be musculoskeletal and actually coming from her neck.  She feels like since really making some adjustments to her posture and sleep position at night it has made a big difference and seems to be helping.  Neck pain -continue to work on posture and sleep position which does seem to be helpful as well as wearing a supportive bra.  Congratulated her on smoking cessation.   Meds ordered this encounter  Medications  . lisinopril (ZESTRIL) 5 MG tablet    Sig: Take 1 tablet (5 mg total) by mouth daily.    Dispense:  90 tablet    Refill:  0    Follow-up: Return in about 3 months (around 01/13/2020) for Hypertension.    Beatrice Lecher, MD

## 2019-10-15 NOTE — Assessment & Plan Note (Signed)
Home blood pressures have been running consistently high.  So we discussed options including maybe starting a low-dose of lisinopril and over the next several months working on trying to get back into her routine with regular exercise, low-salt diet and losing some weight.  She admits she has gained some since Covid.  If at that point blood pressures are coming down nicely then we can always discontinue the lisinopril.  She will need to go for BMP in about 1 week.

## 2019-10-15 NOTE — Progress Notes (Signed)
She reports that the headaches have gotten much better.  She has been keeping an eye on her BP. She has noticed that it has been running as high as 160/90 but does get down to the mid 130's. Her diastolic ranges between [id number]    She has started working out for the past month.

## 2019-10-22 ENCOUNTER — Telehealth: Payer: Self-pay | Admitting: Neurology

## 2019-10-22 NOTE — Telephone Encounter (Signed)
[phone], [facility], pt called, to call should have already been notified.

## 2019-10-22 NOTE — Telephone Encounter (Signed)
Patient is calling in about needing an in-network opthalmologist she said someone was supposed to be looking into this for her and sending a referral. Thanks!

## 2019-10-25 ENCOUNTER — Telehealth: Payer: Self-pay | Admitting: Neurology

## 2019-10-25 ENCOUNTER — Other Ambulatory Visit: Payer: Self-pay

## 2019-10-25 DIAGNOSIS — R519 Headache, unspecified: Secondary | ICD-10-CM

## 2019-10-25 DIAGNOSIS — E236 Other disorders of pituitary gland: Secondary | ICD-10-CM

## 2019-10-25 NOTE — Telephone Encounter (Signed)
Faxed notes and referrals, to [phone], office [facility] [phone]. Pt notified all this was resent.

## 2019-10-25 NOTE — Telephone Encounter (Addendum)
Patient called in needing to check on the Referral Status to an Ophthalmologist. She said she called their office but they had no Referral. Please Call. Thank you

## 2019-11-06 ENCOUNTER — Encounter: Payer: Self-pay | Admitting: Family Medicine

## 2019-11-06 LAB — BASIC METABOLIC PANEL WITH GFR
BUN: 11 mg/dL (ref 7–25)
CO2: 30 mmol/L (ref 20–32)
Calcium: 9.3 mg/dL (ref 8.6–10.2)
Chloride: 104 mmol/L (ref 98–110)
Creat: 0.72 mg/dL (ref 0.50–1.10)
GFR, Est African American: 121 mL/min/{1.73_m2} (ref >=60)
GFR, Est Non African American: 104 mL/min/{1.73_m2} (ref >=60)
Glucose, Bld: 83 mg/dL (ref 65–99)
Potassium: 4.1 mmol/L (ref 3.5–5.3)
Sodium: 139 mmol/L (ref 135–146)

## 2019-11-07 NOTE — Progress Notes (Signed)
All labs are normal. 

## 2019-12-31 ENCOUNTER — Other Ambulatory Visit: Payer: Self-pay | Admitting: Family Medicine

## 2020-01-01 ENCOUNTER — Other Ambulatory Visit: Payer: Self-pay | Admitting: Family Medicine

## 2020-01-01 DIAGNOSIS — Z1231 Encounter for screening mammogram for malignant neoplasm of breast: Secondary | ICD-10-CM

## 2020-01-13 ENCOUNTER — Encounter: Payer: Self-pay | Admitting: Family Medicine

## 2020-01-13 ENCOUNTER — Other Ambulatory Visit: Payer: Self-pay

## 2020-01-13 ENCOUNTER — Ambulatory Visit (INDEPENDENT_AMBULATORY_CARE_PROVIDER_SITE_OTHER): Payer: BC Managed Care – PPO | Admitting: Family Medicine

## 2020-01-13 VITALS — BP 118/68 | HR 72 | Ht 63.0 in | Wt 191.0 lb

## 2020-01-13 DIAGNOSIS — E236 Other disorders of pituitary gland: Secondary | ICD-10-CM | POA: Insufficient documentation

## 2020-01-13 DIAGNOSIS — I10 Essential (primary) hypertension: Secondary | ICD-10-CM

## 2020-01-13 DIAGNOSIS — F4321 Adjustment disorder with depressed mood: Secondary | ICD-10-CM | POA: Diagnosis not present

## 2020-01-13 NOTE — Assessment & Plan Note (Signed)
Pressure does look great today.  She does occasionally double up on the lisinopril so we did discuss that if she is doing that frequently then we can always increase her to 10 mg if needed.  Labs are up-to-date.  I will see her back in 6 months.

## 2020-01-13 NOTE — Progress Notes (Signed)
Established Patient Office Visit  Subjective:  Patient ID: Kayla Munoz, female    DOB: [birth year]  Age: 44 y.o. MRN: [id number]  CC:  Chief Complaint  Patient presents with  . Hypertension    HPI Kayla Munoz presents for HTN.  Hypertension- Pt denies chest pain, SOB, dizziness, or heart palpitations.  Taking meds as directed w/o problems.  Denies medication side effects.    Fortunately her mother just passed away about a week ago suddenly from heart failure.  She was in her apartment when it happened and she actually tried to resuscitate her.  She is now living with her aunt at least temporarily until they can get her new apartment unit.  She says she is trying to stay positive and focus but it has been a struggle.  She is actually planning on going back to work next week.  Past Medical History:  Diagnosis Date  . Anemia   . Essential hypertension 10/15/2019  . Fibroids   . Vitamin D deficiency 11/26/2018    Past Surgical History:  Procedure Laterality Date  . UTERINE FIBROID SURGERY  04/2011    Family History  Problem Relation Age of Onset  . Diabetes Father   . Hypertension Father   . Kidney disease Father   . Diabetes Mother   . Heart disease Mother   . Hypertension Mother   . Heart failure Mother   . Parkinson's disease Paternal Uncle   . Stomach cancer Paternal Uncle   . Breast cancer Neg Hx   . Colon cancer Neg Hx   . Ovarian cancer Neg Hx     Social History   Socioeconomic History  . Marital status: Single    Spouse name: Not on file  . Number of children: Not on file  . Years of education: Not on file  . Highest education level: Not on file  Occupational History  . Not on file  Tobacco Use  . Smoking status: Former Smoker    Packs/day: 0.50    Years: 12.00    Pack years: 6.00    Types: Cigarettes    Quit date: 05/19/2014    Years since quitting: 5.6  . Smokeless tobacco: Never Used  Substance and Sexual Activity  . Alcohol use: Yes     Alcohol/week: 7.0 standard drinks    Types: 7 Standard drinks or equivalent per week  . Drug use: Not Currently    Types: Marijuana    Comment: quit cold [place] 07/2019, previously smoked 1-2 times daily  . Sexual activity: Not Currently    Birth control/protection: I.U.D.  Other Topics Concern  . Not on file  Social History Narrative   Right handed   Lives in one story house    Drinks caffeine   Social Determinants of Health   Financial Resource Strain:   . Difficulty of Paying Living Expenses:   Food Insecurity:   . Worried About Charity fundraiser in the Last Year:   . Arboriculturist in the Last Year:   Transportation Needs:   . Film/video editor (Medical):   Marland Kitchen Lack of Transportation (Non-Medical):   Physical Activity:   . Days of Exercise per Week:   . Minutes of Exercise per Session:   Stress:   . Feeling of Stress :   Social Connections:   . Frequency of Communication with Friends and Family:   . Frequency of Social Gatherings with Friends and Family:   . Attends Religious Services:   .  Active Member of Clubs or Organizations:   . Attends Archivist Meetings:   Marland Kitchen Marital Status:   Intimate Partner Violence:   . Fear of Current or Ex-Partner:   . Emotionally Abused:   Marland Kitchen Physically Abused:   . Sexually Abused:     Outpatient Medications Prior to Visit  Medication Sig Dispense Refill  . Levonorgestrel (SKYLA) 13.5 MG IUD by Intrauterine route.    Marland Kitchen lisinopril (ZESTRIL) 5 MG tablet TAKE 1 TABLET BY MOUTH EVERY DAY 90 tablet 0   No facility-administered medications prior to visit.    No Known Allergies  ROS Review of Systems    Objective:    Physical Exam  Constitutional: She is oriented to person, place, and time. She appears well-developed and well-nourished.  HENT:  Head: Normocephalic and atraumatic.  Cardiovascular: Normal rate, regular rhythm and normal heart sounds.  Pulmonary/Chest: Effort normal and breath sounds normal.   Neurological: She is alert and oriented to person, place, and time.  Skin: Skin is warm and dry.  Psychiatric: She has a normal mood and affect. Her behavior is normal.    BP 118/68   Pulse 72   Ht 5\' 3"  (1.6 m)   Wt 191 lb (86.6 kg)   SpO2 100%   BMI 33.83 kg/m  Wt Readings from Last 3 Encounters:  01/13/20 191 lb (86.6 kg)  10/15/19 191 lb (86.6 kg)  10/03/19 190 lb (86.2 kg)     There are no preventive care reminders to display for this patient.  There are no preventive care reminders to display for this patient.  Lab Results  Component Value Date   TSH 0.50 01/25/2016   Lab Results  Component Value Date   WBC 8.6 10/01/2019   HGB 14.9 10/01/2019   HCT 44.6 10/01/2019   MCV 98.0 10/01/2019   PLT 320 10/01/2019   Lab Results  Component Value Date   NA 139 11/06/2019   K 4.1 11/06/2019   CO2 30 11/06/2019   GLUCOSE 83 11/06/2019   BUN 11 11/06/2019   CREATININE 0.72 11/06/2019   BILITOT 0.4 10/01/2019   ALKPHOS 35 06/06/2016   AST 14 10/01/2019   ALT 10 10/01/2019   PROT 7.5 10/01/2019   ALBUMIN 3.9 06/06/2016   CALCIUM 9.3 11/06/2019   Lab Results  Component Value Date   CHOL 142 11/14/2018   Lab Results  Component Value Date   HDL 53 11/14/2018   Lab Results  Component Value Date   LDLCALC 78 11/14/2018   Lab Results  Component Value Date   TRIG 37 11/14/2018   Lab Results  Component Value Date   CHOLHDL 2.7 11/14/2018   Lab Results  Component Value Date   HGBA1C 5.3 06/06/2016      Assessment & Plan:   Problem List Items Addressed This Visit      Cardiovascular and Mediastinum   Essential hypertension - Primary    Pressure does look great today.  She does occasionally double up on the lisinopril so we did discuss that if she is doing that frequently then we can always increase her to 10 mg if needed.  Labs are up-to-date.  I will see her back in 6 months.        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  We checked on the referral and evidently was closed.  We will go ahead and place new  referral today and try to get her scheduled.      Empty sella [facility])   Relevant Orders   Ambulatory referral to Ophthalmology    Other Visit Diagnoses    Grief         Kayla Munoz is planning on getting back in with her therapist which I think would be a great idea.  She is otherwise doing okay.  No orders of the defined types were placed in this encounter.   Follow-up: Return in about 6 months (around 07/14/2020) for Hypertension.    Beatrice Lecher, MD

## 2020-01-13 NOTE — Assessment & Plan Note (Signed)
She said she never heard back from ophthalmology referral.  We checked on the referral and evidently was closed.  We will go ahead and place new referral today and try to get her scheduled.

## 2020-02-12 ENCOUNTER — Ambulatory Visit (INDEPENDENT_AMBULATORY_CARE_PROVIDER_SITE_OTHER): Payer: BC Managed Care – PPO

## 2020-02-12 ENCOUNTER — Other Ambulatory Visit: Payer: Self-pay

## 2020-02-12 DIAGNOSIS — Z1231 Encounter for screening mammogram for malignant neoplasm of breast: Secondary | ICD-10-CM

## 2020-02-13 ENCOUNTER — Other Ambulatory Visit: Payer: Self-pay | Admitting: Family Medicine

## 2020-02-13 ENCOUNTER — Encounter: Payer: Self-pay | Admitting: Family Medicine

## 2020-02-13 DIAGNOSIS — R928 Other abnormal and inconclusive findings on diagnostic imaging of breast: Secondary | ICD-10-CM

## 2020-02-19 ENCOUNTER — Other Ambulatory Visit: Payer: BC Managed Care – PPO

## 2020-02-20 ENCOUNTER — Ambulatory Visit
Admission: RE | Admit: 2020-02-20 | Discharge: 2020-02-20 | Disposition: A | Discharge status: Home / Self Care | Payer: BC Managed Care – PPO | Source: Ambulatory Visit | Attending: Family Medicine | Admitting: Family Medicine

## 2020-02-20 ENCOUNTER — Other Ambulatory Visit: Payer: Self-pay

## 2020-02-20 DIAGNOSIS — R928 Other abnormal and inconclusive findings on diagnostic imaging of breast: Secondary | ICD-10-CM

## 2020-03-16 ENCOUNTER — Other Ambulatory Visit: Payer: Self-pay | Admitting: Family Medicine

## 2020-03-31 ENCOUNTER — Other Ambulatory Visit: Payer: Self-pay | Admitting: Family Medicine

## 2020-03-31 ENCOUNTER — Other Ambulatory Visit: Payer: Self-pay

## 2020-03-31 MED ORDER — LISINOPRIL 10 MG PO TABS
10.0000 mg | ORAL_TABLET | Freq: Every day | ORAL | 1 refills | Status: DC
Start: 2020-03-31 — End: 2020-09-10

## 2020-03-31 NOTE — Telephone Encounter (Signed)
Patient called for Lisinopril refills. This was sent to [facility] in June, but they did not receive it and she was wanting to increase to 10 mg.   Last OV states "She does occasionally double up on the lisinopril so we did discuss that if she is doing that frequently then we can always increase her to 10 mg if needed.  Labs are up-to-date.  I will see her back in 6 months."  Reports elevated blood pressure reading this morning; systolic in the 871U.   RX for Lisinopril 10 mg pended, please send if appropriate

## 2020-04-01 NOTE — Telephone Encounter (Signed)
Left pt msg that 10mg  RX was sent

## 2020-04-07 ENCOUNTER — Encounter: Payer: Self-pay | Admitting: Family Medicine

## 2020-04-09 ENCOUNTER — Other Ambulatory Visit: Payer: Self-pay

## 2020-04-09 ENCOUNTER — Ambulatory Visit (INDEPENDENT_AMBULATORY_CARE_PROVIDER_SITE_OTHER): Payer: BC Managed Care – PPO | Admitting: Family Medicine

## 2020-04-09 ENCOUNTER — Encounter: Payer: Self-pay | Admitting: Family Medicine

## 2020-04-09 VITALS — BP 110/65 | HR 67 | Ht 63.0 in | Wt 192.0 lb

## 2020-04-09 DIAGNOSIS — I10 Essential (primary) hypertension: Secondary | ICD-10-CM

## 2020-04-09 NOTE — Assessment & Plan Note (Signed)
She came in today because her blood pressures actually been elevated recently but it actually looks phenomenal today so I am hesitant to adjust her medication as it might actually cause hypotension.  Just really encouraged her to work healthy diet and really cutting back on salt intake.  Try to work hard on getting adequate sleep.  She says she has been walking almost daily for exercise which is great so just encouraged her to keep that up and to avoid caffeine.  Check blood pressures daily for the next 2 weeks.  If overall they seem well controlled then we will just continue current regimen and plan to follow-up in 6 months.  If she still seeing frequent elevated blood pressures then we may need to make an adjustment.  Also due for labs in August so I went ahead and printed her lab slip today.

## 2020-04-09 NOTE — Progress Notes (Signed)
Established Patient Office Visit  Subjective:  Patient ID: Kayla Munoz, female    DOB: 03-24-1976  Age: 44 y.o. MRN: [id number]  CC: No chief complaint on file.   HPI Kayla Munoz presents for   Hypertension- Pt denies chest pain, SOB, dizziness, or heart palpitations.  Taking meds as directed w/o problems.  Denies medication side effects.  No swelling.    BPS have been running a little higher at home.  She says she went to visit a friend in the hospital and had coffee and then checked her BP and it was 171/92.  She checked it the following day and it started trending down it was in the 150s and then later in the evening in the low 130s.  She says about a week ago both blood pressure was also in the mid 150s.  She admits she has not been doing the best with salt restricting.  She also has not been sleeping well since her mom passed away she is only really been getting about 4 5 hours but she admits she is been spending a lot of time on her phone.  She denies any chest pain or shortness of breath.  No lower extremity swelling she has had a couple of headaches recently which she does feel like is been triggered from the elevated blood pressure slight levels.    Past Medical History:  Diagnosis Date   Anemia    Essential hypertension 10/15/2019   Fibroids    Vitamin D deficiency 11/26/2018    Past Surgical History:  Procedure Laterality Date   UTERINE FIBROID SURGERY  04/2011    Family History  Problem Relation Age of Onset   Diabetes Father    Hypertension Father    Kidney disease Father    Diabetes Mother    Heart disease Mother    Hypertension Mother    Heart failure Mother    Parkinson's disease Paternal Uncle    Stomach cancer Paternal Uncle    Breast cancer Neg Hx    Colon cancer Neg Hx    Ovarian cancer Neg Hx     Social History   Socioeconomic History   Marital status: Single    Spouse name: Not on file   Number of children: Not on file    Years of education: Not on file   Highest education level: Not on file  Occupational History   Not on file  Tobacco Use   Smoking status: Former Smoker    Packs/day: 0.50    Years: 12.00    Pack years: 6.00    Types: Cigarettes    Quit date: 05/19/2014    Years since quitting: 5.8   Smokeless tobacco: Never Used  Vaping Use   Vaping Use: Never used  Substance and Sexual Activity   Alcohol use: Yes    Alcohol/week: 7.0 standard drinks    Types: 7 Standard drinks or equivalent per week   Drug use: Not Currently    Types: Marijuana    Comment: quit cold [place] 07/2019, previously smoked 1-2 times daily   Sexual activity: Not Currently    Birth control/protection: I.U.D.  Other Topics Concern   Not on file  Social History Narrative   Right handed   Lives in one story house    Drinks caffeine   Social Determinants of Health   Financial Resource Strain:    Difficulty of Paying Living Expenses:   Food Insecurity:    Worried About Charity fundraiser in  the Last Year:    Arboriculturist in the Last Year:   Transportation Needs:    Film/video editor (Medical):    Lack of Transportation (Non-Medical):   Physical Activity:    Days of Exercise per Week:    Minutes of Exercise per Session:   Stress:    Feeling of Stress :   Social Connections:    Frequency of Communication with Friends and Family:    Frequency of Social Gatherings with Friends and Family:    Attends Religious Services:    Active Member of Clubs or Organizations:    Attends Music therapist:    Marital Status:   Intimate Partner Violence:    Fear of Current or Ex-Partner:    Emotionally Abused:    Physically Abused:    Sexually Abused:     Outpatient Medications Prior to Visit  Medication Sig Dispense Refill   Levonorgestrel (SKYLA) 13.5 MG IUD by Intrauterine route.     lisinopril (ZESTRIL) 10 MG tablet Take 1 tablet (10 mg total) by mouth daily. 90  tablet 1   No facility-administered medications prior to visit.    No Known Allergies  ROS Review of Systems    Objective:    Physical Exam Constitutional:      Appearance: She is well-developed.  HENT:     Head: Normocephalic and atraumatic.  Cardiovascular:     Rate and Rhythm: Normal rate and regular rhythm.     Heart sounds: Normal heart sounds.  Pulmonary:     Effort: Pulmonary effort is normal.     Breath sounds: Normal breath sounds.  Skin:    General: Skin is warm and dry.  Neurological:     Mental Status: She is alert and oriented to person, place, and time.  Psychiatric:        Behavior: Behavior normal.     BP 110/65    Pulse 67    Ht 5\' 3"  (1.6 m)    Wt 192 lb (87.1 kg)    SpO2 100%    BMI 34.01 kg/m  Wt Readings from Last 3 Encounters:  04/09/20 192 lb (87.1 kg)  01/13/20 191 lb (86.6 kg)  10/15/19 191 lb (86.6 kg)     There are no preventive care reminders to display for this patient.  There are no preventive care reminders to display for this patient.  Lab Results  Component Value Date   TSH 0.50 01/25/2016   Lab Results  Component Value Date   WBC 8.6 10/01/2019   HGB 14.9 10/01/2019   HCT 44.6 10/01/2019   MCV 98.0 10/01/2019   PLT 320 10/01/2019   Lab Results  Component Value Date   NA 139 11/06/2019   K 4.1 11/06/2019   CO2 30 11/06/2019   GLUCOSE 83 11/06/2019   BUN 11 11/06/2019   CREATININE 0.72 11/06/2019   BILITOT 0.4 10/01/2019   ALKPHOS 35 06/06/2016   AST 14 10/01/2019   ALT 10 10/01/2019   PROT 7.5 10/01/2019   ALBUMIN 3.9 06/06/2016   CALCIUM 9.3 11/06/2019   Lab Results  Component Value Date   CHOL 142 11/14/2018   Lab Results  Component Value Date   HDL 53 11/14/2018   Lab Results  Component Value Date   LDLCALC 78 11/14/2018   Lab Results  Component Value Date   TRIG 37 11/14/2018   Lab Results  Component Value Date   CHOLHDL 2.7 11/14/2018   Lab Results  Component  Value Date   HGBA1C 5.3  06/06/2016      Assessment & Plan:   Problem List Items Addressed This Visit      Cardiovascular and Mediastinum   Essential hypertension - Primary    She came in today because her blood pressures actually been elevated recently but it actually looks phenomenal today so I am hesitant to adjust her medication as it might actually cause hypotension.  Just really encouraged her to work healthy diet and really cutting back on salt intake.  Try to work hard on getting adequate sleep.  She says she has been walking almost daily for exercise which is great so just encouraged her to keep that up and to avoid caffeine.  Check blood pressures daily for the next 2 weeks.  If overall they seem well controlled then we will just continue current regimen and plan to follow-up in 6 months.  If she still seeing frequent elevated blood pressures then we may need to make an adjustment.  Also due for labs in August so I went ahead and printed her lab slip today.      Relevant Orders   COMPLETE METABOLIC PANEL WITH GFR   Lipid panel   TSH      No orders of the defined types were placed in this encounter.   Follow-up: Return in about 6 months (around 10/10/2020) for Hypertension.    Beatrice Lecher, MD

## 2020-06-09 ENCOUNTER — Other Ambulatory Visit: Payer: Self-pay | Admitting: Family Medicine

## 2020-06-09 DIAGNOSIS — L309 Dermatitis, unspecified: Secondary | ICD-10-CM

## 2020-07-14 ENCOUNTER — Ambulatory Visit: Payer: BC Managed Care – PPO | Admitting: Family Medicine

## 2020-09-09 ENCOUNTER — Other Ambulatory Visit: Payer: Self-pay | Admitting: Family Medicine

## 2020-10-12 ENCOUNTER — Ambulatory Visit: Payer: BC Managed Care – PPO | Admitting: Family Medicine

## 2020-10-26 ENCOUNTER — Ambulatory Visit (INDEPENDENT_AMBULATORY_CARE_PROVIDER_SITE_OTHER): Payer: BC Managed Care – PPO | Admitting: Family Medicine

## 2020-10-26 ENCOUNTER — Encounter: Payer: Self-pay | Admitting: Family Medicine

## 2020-10-26 ENCOUNTER — Other Ambulatory Visit: Payer: Self-pay

## 2020-10-26 VITALS — BP 126/75 | HR 69 | Ht 63.0 in | Wt 194.0 lb

## 2020-10-26 DIAGNOSIS — I10 Essential (primary) hypertension: Secondary | ICD-10-CM

## 2020-10-26 NOTE — Assessment & Plan Note (Addendum)
Well controlled. Continue current regimen. Follow up in  6 mo.  I am really excited about some of the changes that she has made including exercising more regularly and losing weight.  She has noticed improvement in some of her back pain as well.

## 2020-10-26 NOTE — Progress Notes (Signed)
Established Patient Office Visit  Subjective:  Patient ID: Kayla Munoz, female    DOB: 01/22/1976  Age: 45 y.o. MRN: [id number]  CC:  Chief Complaint  Patient presents with  . Hypertension    HPI Kayla Munoz presents for   Hypertension- Pt denies chest pain, SOB, dizziness, or heart palpitations.  Taking meds as directed w/o problems.  Denies medication side effects.    She is doing well otherwise she said she is really been trying to get more healthy she has been running and doing some power walking 3 to 4 days per weight.   Had a hard time over the Holidays missing her mom who passed away.    Past Medical History:  Diagnosis Date  . Anemia   . Essential hypertension 10/15/2019  . Fibroids   . Vitamin D deficiency 11/26/2018    Past Surgical History:  Procedure Laterality Date  . UTERINE FIBROID SURGERY  04/2011    Family History  Problem Relation Age of Onset  . Diabetes Father   . Hypertension Father   . Kidney disease Father   . Diabetes Mother   . Heart disease Mother   . Hypertension Mother   . Heart failure Mother   . Parkinson's disease Paternal Uncle   . Stomach cancer Paternal Uncle   . Breast cancer Neg Hx   . Colon cancer Neg Hx   . Ovarian cancer Neg Hx     Social History   Socioeconomic History  . Marital status: Single    Spouse name: Not on file  . Number of children: Not on file  . Years of education: Not on file  . Highest education level: Not on file  Occupational History  . Not on file  Tobacco Use  . Smoking status: Former Smoker    Packs/day: 0.50    Years: 12.00    Pack years: 6.00    Types: Cigarettes    Quit date: 05/19/2014    Years since quitting: 6.4  . Smokeless tobacco: Never Used  Vaping Use  . Vaping Use: Never used  Substance and Sexual Activity  . Alcohol use: Yes    Alcohol/week: 7.0 standard drinks    Types: 7 Standard drinks or equivalent per week  . Drug use: Not Currently    Types: Marijuana     Comment: quit cold [place] 07/2019, previously smoked 1-2 times daily  . Sexual activity: Not Currently    Birth control/protection: I.U.D.  Other Topics Concern  . Not on file  Social History Narrative   Right handed   Lives in one story house    Drinks caffeine   Social Determinants of Health   Financial Resource Strain: Not on file  Food Insecurity: Not on file  Transportation Needs: Not on file  Physical Activity: Not on file  Stress: Not on file  Social Connections: Not on file  Intimate Partner Violence: Not on file    Outpatient Medications Prior to Visit  Medication Sig Dispense Refill  . ketoconazole (NIZORAL) 2 % shampoo WASH AFFECTED AREAS ON SCALP ONCE A DAY 120 mL PRN  . Levonorgestrel (SKYLA) 13.5 MG IUD by Intrauterine route.    Marland Kitchen lisinopril (ZESTRIL) 10 MG tablet TAKE 1 TABLET BY MOUTH EVERY DAY 90 tablet 1   No facility-administered medications prior to visit.    No Known Allergies  ROS Review of Systems    Objective:    Physical Exam Constitutional:      Appearance: She is well-developed  and well-nourished.  HENT:     Head: Normocephalic and atraumatic.  Cardiovascular:     Rate and Rhythm: Normal rate and regular rhythm.     Heart sounds: Normal heart sounds.  Pulmonary:     Effort: Pulmonary effort is normal.     Breath sounds: Normal breath sounds.  Skin:    General: Skin is warm and dry.  Neurological:     Mental Status: She is alert and oriented to person, place, and time.  Psychiatric:        Mood and Affect: Mood and affect normal.        Behavior: Behavior normal.     BP 126/75   Pulse 69   Ht 5\' 3"  (1.6 m)   Wt 194 lb (88 kg)   SpO2 99%   BMI 34.37 kg/m  Wt Readings from Last 3 Encounters:  10/26/20 194 lb (88 kg)  04/09/20 192 lb (87.1 kg)  01/13/20 191 lb (86.6 kg)     There are no preventive care reminders to display for this patient.  There are no preventive care reminders to display for this patient.  Lab  Results  Component Value Date   TSH 0.50 01/25/2016   Lab Results  Component Value Date   WBC 8.6 10/01/2019   HGB 14.9 10/01/2019   HCT 44.6 10/01/2019   MCV 98.0 10/01/2019   PLT 320 10/01/2019   Lab Results  Component Value Date   NA 139 11/06/2019   K 4.1 11/06/2019   CO2 30 11/06/2019   GLUCOSE 83 11/06/2019   BUN 11 11/06/2019   CREATININE 0.72 11/06/2019   BILITOT 0.4 10/01/2019   ALKPHOS 35 06/06/2016   AST 14 10/01/2019   ALT 10 10/01/2019   PROT 7.5 10/01/2019   ALBUMIN 3.9 06/06/2016   CALCIUM 9.3 11/06/2019   Lab Results  Component Value Date   CHOL 142 11/14/2018   Lab Results  Component Value Date   HDL 53 11/14/2018   Lab Results  Component Value Date   LDLCALC 78 11/14/2018   Lab Results  Component Value Date   TRIG 37 11/14/2018   Lab Results  Component Value Date   CHOLHDL 2.7 11/14/2018   Lab Results  Component Value Date   HGBA1C 5.3 06/06/2016      Assessment & Plan:   Problem List Items Addressed This Visit      Cardiovascular and Mediastinum   Essential hypertension - Primary    Well controlled. Continue current regimen. Follow up in  6 mo.  I am really excited about some of the changes that she has made including exercising more regularly and losing weight.  She has noticed improvement in some of her back pain as well.      Relevant Orders   COMPLETE METABOLIC PANEL WITH GFR   Lipid panel   CBC      No orders of the defined types were placed in this encounter.   Follow-up: Return in about 6 months (around 04/25/2021) for Hypertension.    Beatrice Lecher, MD

## 2020-10-27 LAB — CBC
HCT: 42.5 % (ref 35.0–45.0)
Hemoglobin: 14.6 g/dL (ref 11.7–15.5)
MCH: 33.1 pg — ABNORMAL HIGH (ref 27.0–33.0)
MCHC: 34.4 g/dL (ref 32.0–36.0)
MCV: 96.4 fL (ref 80.0–100.0)
MPV: 10.4 fL (ref 7.5–12.5)
Platelets: 293 10*3/uL (ref 140–400)
RBC: 4.41 10*6/uL (ref 3.80–5.10)
RDW: 13.4 % (ref 11.0–15.0)
WBC: 8.9 10*3/uL (ref 3.8–10.8)

## 2020-10-27 LAB — COMPLETE METABOLIC PANEL WITH GFR
AG Ratio: 1.7 (calc) (ref 1.0–2.5)
ALT: 18 U/L (ref 6–29)
AST: 20 U/L (ref 10–30)
Albumin: 4.8 g/dL (ref 3.6–5.1)
Alkaline phosphatase (APISO): 60 U/L (ref 31–125)
BUN: 9 mg/dL (ref 7–25)
CO2: 29 mmol/L (ref 20–32)
Calcium: 9.8 mg/dL (ref 8.6–10.2)
Chloride: 103 mmol/L (ref 98–110)
Creat: 0.74 mg/dL (ref 0.50–1.10)
GFR, Est African American: 116 mL/min/{1.73_m2} (ref >=60)
GFR, Est Non African American: 100 mL/min/{1.73_m2} (ref >=60)
Globulin: 2.9 g/dL (calc) (ref 1.9–3.7)
Glucose, Bld: 91 mg/dL (ref 65–99)
Potassium: 3.8 mmol/L (ref 3.5–5.3)
Sodium: 139 mmol/L (ref 135–146)
Total Bilirubin: 0.5 mg/dL (ref 0.2–1.2)
Total Protein: 7.7 g/dL (ref 6.1–8.1)

## 2020-10-27 LAB — LIPID PANEL
Cholesterol: 162 mg/dL (ref <200)
HDL: 64 mg/dL (ref >=50)
LDL Cholesterol (Calc): 85 mg/dL (calc)
Non-HDL Cholesterol (Calc): 98 mg/dL (calc) (ref <130)
Total CHOL/HDL Ratio: 2.5 (calc) (ref <5.0)
Triglycerides: 51 mg/dL (ref <150)

## 2020-10-28 NOTE — Progress Notes (Signed)
All labs are normal. 

## 2020-10-29 ENCOUNTER — Ambulatory Visit: Payer: Self-pay | Admitting: Family Medicine

## 2020-12-10 ENCOUNTER — Encounter: Payer: Self-pay | Admitting: Family Medicine

## 2020-12-10 ENCOUNTER — Ambulatory Visit: Payer: BC Managed Care – PPO | Admitting: Family Medicine

## 2020-12-10 ENCOUNTER — Other Ambulatory Visit: Payer: Self-pay

## 2020-12-10 VITALS — BP 120/79 | HR 82 | Temp 99.3°F

## 2020-12-10 DIAGNOSIS — R220 Localized swelling, mass and lump, head: Secondary | ICD-10-CM

## 2020-12-10 NOTE — Progress Notes (Signed)
Acute Office Visit  Subjective:    Patient ID: Kayla Munoz, female    DOB: [birth year], 45 y.o.   MRN: [id number]  Chief Complaint  Patient presents with  . Oral Swelling    HPI Patient is in today for lip swelling and recent rashes.  For the past 2-3 weeks patient has noticed an increase in instances of dry, itchy, red skin that almost appears as hives to her extremities. She states it shows up almost daily, typically in the evenings when she is getting undressed after work and into [employer]. She states it usually just last for a few minutes to an hour at most and then the rash and itching are gone. She has never taken anything to help them go away faster. Reports her skin has been more dry lately with the weather changes.  This morning she woke up and immediately noticed her upper lip was swollen. She denies any cuts, sores, injuries, or bug bites. She states there was no pain, no throat/tongue itching or swelling, no hives today, no shortness of breath. At the time of this visit she reports the swelling is probably already down by at least 40% and she has not taken anything today for it.  No recent change in detergent/soaps. Reports she has been eating a lot more peanut butter lately - yesterday was probably the most she's eaten in one day. She also reports increased stress recently.   States she has seen an allergist as a kid and has some dermatitis, but has not had any other allergy problems since childhood.   Past Medical History:  Diagnosis Date  . Anemia   . Essential hypertension 10/15/2019  . Fibroids   . Vitamin D deficiency 11/26/2018    Past Surgical History:  Procedure Laterality Date  . UTERINE FIBROID SURGERY  04/2011    Family History  Problem Relation Age of Onset  . Diabetes Father   . Hypertension Father   . Kidney disease Father   . Diabetes Mother   . Heart disease Mother   . Hypertension Mother   . Heart failure Mother   . Parkinson's disease  Paternal Uncle   . Stomach cancer Paternal Uncle   . Breast cancer Neg Hx   . Colon cancer Neg Hx   . Ovarian cancer Neg Hx     Social History   Socioeconomic History  . Marital status: Single    Spouse name: Not on file  . Number of children: Not on file  . Years of education: Not on file  . Highest education level: Not on file  Occupational History  . Not on file  Tobacco Use  . Smoking status: Former Smoker    Packs/day: 0.50    Years: 12.00    Pack years: 6.00    Types: Cigarettes    Quit date: 05/19/2014    Years since quitting: 6.5  . Smokeless tobacco: Never Used  Vaping Use  . Vaping Use: Never used  Substance and Sexual Activity  . Alcohol use: Yes    Alcohol/week: 7.0 standard drinks    Types: 7 Standard drinks or equivalent per week  . Drug use: Not Currently    Types: Marijuana    Comment: quit cold [place] 07/2019, previously smoked 1-2 times daily  . Sexual activity: Not Currently    Birth control/protection: I.U.D.  Other Topics Concern  . Not on file  Social History Narrative   Right handed   Lives in one story house  Drinks caffeine   Social Determinants of Radio broadcast assistant Strain: Not on file  Food Insecurity: Not on file  Transportation Needs: Not on file  Physical Activity: Not on file  Stress: Not on file  Social Connections: Not on file  Intimate Partner Violence: Not on file    Outpatient Medications Prior to Visit  Medication Sig Dispense Refill  . ketoconazole (NIZORAL) 2 % shampoo WASH AFFECTED AREAS ON SCALP ONCE A DAY 120 mL PRN  . Levonorgestrel (SKYLA) 13.5 MG IUD by Intrauterine route.    Marland Kitchen lisinopril (ZESTRIL) 10 MG tablet TAKE 1 TABLET BY MOUTH EVERY DAY 90 tablet 1   No facility-administered medications prior to visit.    No Known Allergies  Review of Systems     Objective:    Physical Exam Vitals reviewed.  Constitutional:      Appearance: Normal appearance.  HENT:     Head: Normocephalic and  atraumatic.     Nose: Nose normal.     Mouth/Throat:     Mouth: Mucous membranes are moist.     Pharynx: Oropharynx is clear. No posterior oropharyngeal erythema.     Comments: Mild symmetrical swelling to upper lip, no sores/cuts/bug bites visible, no erythema Cardiovascular:     Rate and Rhythm: Normal rate and regular rhythm.     Pulses: Normal pulses.     Heart sounds: Normal heart sounds.  Pulmonary:     Effort: Pulmonary effort is normal.     Breath sounds: Normal breath sounds.  Skin:    General: Skin is warm and dry.     Findings: No rash.  Neurological:     General: No focal deficit present.     Mental Status: She is alert and oriented to person, place, and time. Mental status is at baseline.  Psychiatric:        Mood and Affect: Mood normal.        Behavior: Behavior normal.        Thought Content: Thought content normal.        Judgment: Judgment normal.     There were no vitals taken for this visit. Wt Readings from Last 3 Encounters:  10/26/20 194 lb (88 kg)  04/09/20 192 lb (87.1 kg)  01/13/20 191 lb (86.6 kg)    There are no preventive care reminders to display for this patient.  There are no preventive care reminders to display for this patient.   Lab Results  Component Value Date   TSH 0.50 01/25/2016   Lab Results  Component Value Date   WBC 8.9 10/26/2020   HGB 14.6 10/26/2020   HCT 42.5 10/26/2020   MCV 96.4 10/26/2020   PLT 293 10/26/2020   Lab Results  Component Value Date   NA 139 10/26/2020   K 3.8 10/26/2020   CO2 29 10/26/2020   GLUCOSE 91 10/26/2020   BUN 9 10/26/2020   CREATININE 0.74 10/26/2020   BILITOT 0.5 10/26/2020   ALKPHOS 35 06/06/2016   AST 20 10/26/2020   ALT 18 10/26/2020   PROT 7.7 10/26/2020   ALBUMIN 3.9 06/06/2016   CALCIUM 9.8 10/26/2020   Lab Results  Component Value Date   CHOL 162 10/26/2020   Lab Results  Component Value Date   HDL 64 10/26/2020   Lab Results  Component Value Date   LDLCALC 85  10/26/2020   Lab Results  Component Value Date   TRIG 51 10/26/2020   Lab Results  Component Value Date  CHOLHDL 2.5 10/26/2020   Lab Results  Component Value Date   HGBA1C 5.3 06/06/2016       Assessment & Plan:   Problem List Items Addressed This Visit   None   Visit Diagnoses    Lip swelling    -  Primary      Patient with mild upper lip swelling today that has improved as the day has gone on per patient. No other signs of an acute allergic reaction. Patient stable today. Recommending she start keeping a diary of when she notices the rashes to see if there is any correlation with food intake, skin care products, time of day, environmental triggers, etc. Also recommend she start taking a daily antihistamine and keep benadryl with her. She is not interested in an allergist referral at this time.  Lip swelling is coming down nicely on its own - follow-up if not improving or if it worsens. We discussed epi-pens, but she is not interested at this time. Educated on signs of severe allergic reactions/anaphylaxis and significance of calling 911 in these scenarios.   Follow-up if symptoms worsen or fail to improve.  [employer] Olevia Bowens, DNP, FNP-C

## 2021-01-06 ENCOUNTER — Telehealth: Payer: Self-pay

## 2021-01-06 DIAGNOSIS — T7840XA Allergy, unspecified, initial encounter: Secondary | ICD-10-CM

## 2021-01-06 DIAGNOSIS — R220 Localized swelling, mass and lump, head: Secondary | ICD-10-CM

## 2021-01-06 NOTE — Telephone Encounter (Signed)
Kayla Munoz called and states she would like to move forward with the referral to the allergist. Please advise.

## 2021-01-07 NOTE — Telephone Encounter (Signed)
Patient advised.

## 2021-01-21 ENCOUNTER — Other Ambulatory Visit: Payer: Self-pay | Admitting: Family Medicine

## 2021-01-21 DIAGNOSIS — Z1231 Encounter for screening mammogram for malignant neoplasm of breast: Secondary | ICD-10-CM

## 2021-02-10 NOTE — Progress Notes (Signed)
New Patient Note  RE: Kayla Munoz MRN: [id number] DOB: 1976-03-24 Date of Office Visit: 02/11/2021  Consult requested by: [name], * Primary care provider: [name], MD  Chief Complaint: Angioedema (A week ago woke up with her top lip being swollen )  History of Present Illness: I had the pleasure of seeing [name] for initial evaluation at the [name] on 02/11/2021. She is a 45 y.o. female, who is referred here by [name], MD for the evaluation of lip angioedema.  In March, patient woke up with upper lip swelling. She went to bed fine and just had eaten peanut butter as a snack before bedtime. She also had a lot of salty foods that day as she was traveling and may have taken additional lisinopril tablets that day.  Denies any other associated symptoms.  Suspected triggers are unsure but concerned about peanut allergies. Denies any fevers, chills, changes in medications, foods, personal care products or recent infections. Patient has been on lisinopril for about 1 year with some dry cough. She has tried the following therapies: none.  Patient went to see PCP and the lip swelling resolved over 1 day with no intervention.  About 1-2 months prior to this she noted some hives at times which resolved fairly quickly with no medications. No triggers noted.  In April, patient woke up with left cheek swelling. Patient had peanut butter with coffee the day before. Patient has been avoiding peanuts, tree nuts since then.   Previous work up includes: none. Patient is up to date with the following cancer screening tests: mammogram, physical exam, pap smears.  Dietary History: patient has been eating other foods including milk, eggs, sesame, shellfish, fish, soy, wheat, meats, fruits and vegetables.  She reports reading labels and avoiding peanuts, tree nuts in diet completely.  She has been taking loratadine 10mg  daily  with no recent episode.   12/10/2020 PCP visit: "For the past 2-3 weeks patient has noticed an increase in instances of dry, itchy, red skin that almost appears as hives to her extremities. She states it shows up almost daily, typically in the evenings when she is getting undressed after work and into [employer]. She states it usually just last for a few minutes to an hour at most and then the rash and itching are gone. She has never taken anything to help them go away faster. Reports her skin has been more dry lately with the weather changes.  This morning she woke up and immediately noticed her upper lip was swollen. She denies any cuts, sores, injuries, or bug bites. She states there was no pain, no throat/tongue itching or swelling, no hives today, no shortness of breath. At the time of this visit she reports the swelling is probably already down by at least 40% and she has not taken anything today for it.  No recent change in detergent/soaps. Reports she has been eating a lot more peanut butter lately - yesterday was probably the most she's eaten in one day. She also reports increased stress recently.   States she has seen an allergist as a kid and has some dermatitis, but has not had any other allergy problems since childhood. "  Assessment and Plan: [name] is a 45 y.o. female with: Angio-edema 2 episodes of facial swelling. Patient concerned about possibly peanut allergy as she had eaten peanuts the day before onset. She may have also taken additional lisinopril tablets during these  episodes. 1-2 months prior started to have random hives with no triggers.   Today's skin testing showed: Negative to indoor/outdoor allergens, peanuts, tree nuts.   Stop lisinopril as it can sometimes cause swelling and the dry cough.  Contact PCP about starting a different medication for blood pressure - no ace inhibitors.  Next week - may reintroduce peanuts and tree nuts back into diet slowly. Based  on clinical history and negative skin testing results unlikely to be the cause.  Marland Kitchen Avoid the following potential triggers: alcohol, tight clothing, NSAIDs, hot showers and getting overheated. . If you notice any hives/rash then restart loratadine 10mg  daily. . Get bloodwork to rule out other etiologies.    Urticaria  See assessment and plan as above.  Return in about 4 months (around 06/14/2021).  No orders of the defined types were placed in this encounter.   Lab Orders     CBC with Differential/Platelet     Alpha-Gal Panel     ANA w/Reflex     C1 Esterase Inhibitor     C1 esterase inhibitor, functional     C3 and C4     Chronic Urticaria     Comprehensive metabolic panel     Tryptase     Sedimentation rate     C-reactive protein     Thyroid Cascade Profile  Other allergy screening: Asthma: no Rhino conjunctivitis: yes Mild rhinitis symptoms and occasionally takes antihistamines.  Medication allergy: no Hymenoptera allergy: no Eczema:no History of recurrent infections suggestive of immunodeficency: no  Diagnostics: Skin Testing: Environmental allergy panel and select foods. Negative to indoor/outdoor allergens, peanuts, tree nuts.  Results discussed with patient/family.  Airborne Adult Perc - 02/11/21 1000    Time Antigen Placed 1012    Allergen Manufacturer Lavella Hammock    Location Back    Number of Test 59    1. Control-Buffer 50% Glycerol Negative    2. Control-Histamine 1 mg/ml 2+    3. Albumin saline Negative    4. Mascot Negative    5. [place] Negative    6. Johnson Negative    7. [place] Blue Negative    8. Meadow Fescue Negative    9. Perennial Rye Negative    10. Sweet Vernal Negative    11. Timothy Negative    12. Cocklebur Negative    13. Burweed Marshelder Negative    14. Ragweed, short Negative    15. Ragweed, Giant Negative    16. Plantain,  English Negative    17. Lamb's Quarters Negative    18. Sheep Sorrell Negative    19. Rough Pigweed  Negative    20. Marsh Elder, Rough Negative    21. Mugwort, Common Negative    22. Ash mix Negative    23. Birch mix Negative    24. Beech American Negative    25. Box, Elder Negative    26. Cedar, red Negative    27. Cottonwood, [place] Negative    28. Elm mix Negative    29. Hickory Negative    30. Maple mix Negative    31. Oak, [place] mix Negative    32. Pecan Pollen Negative    33. Pine mix Negative    34. Sycamore Eastern Negative    35. Grimsley, Black Pollen Negative    36. Alternaria alternata Negative    37. Cladosporium Herbarum Negative    38. Aspergillus mix Negative    39. Penicillium mix Negative    40. Bipolaris sorokiniana (Helminthosporium) Negative  41. Drechslera spicifera (Curvularia) Negative    42. Mucor plumbeus Negative    43. Fusarium moniliforme Negative    44. Aureobasidium pullulans (pullulara) Negative    45. Rhizopus oryzae Negative    46. Botrytis cinera Negative    47. Epicoccum nigrum Negative    48. Phoma betae Negative    49. Candida Albicans Negative    50. Trichophyton mentagrophytes Negative    51. Mite, D Farinae  5,000 AU/ml Negative    52. Mite, D Pteronyssinus  5,000 AU/ml Negative    53. Cat Hair 10,000 BAU/ml Negative    54.  Dog Epithelia Negative    55. Mixed Feathers Negative    56. Horse Epithelia Negative    57. Cockroach, German Negative    58. Mouse Negative    59. Tobacco Leaf Negative          Food Adult Perc - 02/11/21 1000    Time Antigen Placed 1012    Allergen Manufacturer Lavella Hammock    Location Back    Number of allergen test 9    1. Peanut Negative    10. Cashew Negative    11. Pecan Food Negative    12. Herscher Negative    13. Almond Negative    14. Hazelnut Negative    15. [place] nut Negative    16. Coconut Negative    17. Pistachio Negative           Past Medical History: Patient Active Problem List   Diagnosis Date Noted  . Angio-edema 02/11/2021  . Urticaria 02/11/2021  . Empty sella  ([place]) 01/13/2020  . Empty sella turcica ([place]) 01/13/2020  . Essential hypertension 10/15/2019  . Vitamin D deficiency 11/26/2018  . Family history of heart failure 04/24/2015  . FIBROIDS, UTERUS 06/27/2010  . DYSMENORRHEA 06/25/2009  . Seborrheic dermatitis 06/25/2009   Past Medical History:  Diagnosis Date  . Anemia   . Angio-edema   . Essential hypertension 10/15/2019  . Fibroids   . Urticaria   . Vitamin D deficiency 11/26/2018   Past Surgical History: Past Surgical History:  Procedure Laterality Date  . UTERINE FIBROID SURGERY  04/2011   Medication List:  Current Outpatient Medications  Medication Sig Dispense Refill  . ketoconazole (NIZORAL) 2 % shampoo WASH AFFECTED AREAS ON SCALP ONCE A DAY 120 mL PRN  . Levonorgestrel 13.5 MG IUD by Intrauterine route.     No current facility-administered medications for this visit.   Allergies: No Known Allergies Social History: Social History   Socioeconomic History  . Marital status: Single    Spouse name: Not on file  . Number of children: Not on file  . Years of education: Not on file  . Highest education level: Not on file  Occupational History  . Not on file  Tobacco Use  . Smoking status: Former Smoker    Packs/day: 0.50    Years: 12.00    Pack years: 6.00    Types: Cigarettes    Quit date: 05/19/2014    Years since quitting: 6.7  . Smokeless tobacco: Never Used  Vaping Use  . Vaping Use: Never used  Substance and Sexual Activity  . Alcohol use: Yes    Alcohol/week: 7.0 standard drinks    Types: 7 Standard drinks or equivalent per week  . Drug use: Not Currently    Types: Marijuana    Comment: quit cold [place] 07/2019, previously smoked 1-2 times daily  . Sexual activity: Not Currently  Birth control/protection: I.U.D.  Other Topics Concern  . Not on file  Social History Narrative   Right handed   Lives in one story house    Drinks caffeine   Social Determinants of Health   Financial Resource  Strain: Not on file  Food Insecurity: Not on file  Transportation Needs: Not on file  Physical Activity: Not on file  Stress: Not on file  Social Connections: Not on file   Lives in an apartment. Smoking: quit in 2015 Occupation: librarian  Environmental History: Water Damage/mildew in the house: yes Carpet in the family room: no Carpet in the bedroom: yes Heating: electric Cooling: central Pet: no  Family History: Family History  Problem Relation Age of Onset  . Diabetes Father   . Hypertension Father   . Kidney disease Father   . Allergic rhinitis Father   . Asthma Father   . Diabetes Mother   . Heart disease Mother   . Hypertension Mother   . Heart failure Mother   . Parkinson's disease Paternal Uncle   . Stomach cancer Paternal Uncle   . Breast cancer Neg Hx   . Colon cancer Neg Hx   . Ovarian cancer Neg Hx   . Eczema Neg Hx   . Urticaria Neg Hx    Review of Systems  Constitutional: Negative for appetite change, chills, fever and unexpected weight change.  HENT: Negative for congestion and rhinorrhea.   Eyes: Negative for itching.  Respiratory: Negative for cough, chest tightness, shortness of breath and wheezing.   Cardiovascular: Negative for chest pain.  Gastrointestinal: Negative for abdominal pain.  Genitourinary: Negative for difficulty urinating.  Skin: Negative for rash.  Allergic/Immunologic: Negative for environmental allergies.  Neurological: Negative for headaches.   Objective: BP 130/88 (BP Location: Right Arm, Patient Position: Sitting, Cuff Size: Normal)   Pulse 80   Temp 98.2 F (36.8 C) (Temporal)   Resp 18   Ht 5\' 3"  (1.6 m)   Wt 180 lb (81.6 kg)   BMI 31.89 kg/m  Body mass index is 31.89 kg/m. Physical Exam Vitals and nursing note reviewed.  Constitutional:      Appearance: Normal appearance. She is well-developed.  HENT:     Head: Normocephalic and atraumatic.     Right Ear: External ear normal.     Left Ear: External ear  normal.     Nose: Nose normal.     Mouth/Throat:     Mouth: Mucous membranes are moist.     Pharynx: Oropharynx is clear.  Eyes:     Conjunctiva/sclera: Conjunctivae normal.  Cardiovascular:     Rate and Rhythm: Normal rate and regular rhythm.     Heart sounds: Normal heart sounds. No murmur heard. No friction rub. No gallop.   Pulmonary:     Effort: Pulmonary effort is normal.     Breath sounds: Normal breath sounds. No wheezing, rhonchi or rales.  Abdominal:     Palpations: Abdomen is soft.  Musculoskeletal:     Cervical back: Neck supple.  Skin:    General: Skin is warm.     Findings: No rash.  Neurological:     Mental Status: She is alert and oriented to person, place, and time.  Psychiatric:        Behavior: Behavior normal.    The plan was reviewed with the patient/family, and all questions/concerned were addressed.  It was my pleasure to see Kayla Munoz today and participate in her care. Please feel free to contact me  with any questions or concerns.  Sincerely,  Rexene Alberts, DO Allergy & Immunology  [facility] office: [place] office: [phone]

## 2021-02-11 ENCOUNTER — Encounter: Payer: Self-pay | Admitting: Allergy

## 2021-02-11 ENCOUNTER — Ambulatory Visit: Payer: BC Managed Care – PPO | Admitting: Allergy

## 2021-02-11 ENCOUNTER — Other Ambulatory Visit: Payer: Self-pay

## 2021-02-11 VITALS — BP 130/88 | HR 80 | Temp 98.2°F | Resp 18 | Ht 63.0 in | Wt 180.0 lb

## 2021-02-11 DIAGNOSIS — L509 Urticaria, unspecified: Secondary | ICD-10-CM

## 2021-02-11 DIAGNOSIS — T783XXA Angioneurotic edema, initial encounter: Secondary | ICD-10-CM | POA: Insufficient documentation

## 2021-02-11 DIAGNOSIS — T783XXD Angioneurotic edema, subsequent encounter: Secondary | ICD-10-CM

## 2021-02-11 NOTE — Assessment & Plan Note (Addendum)
2 episodes of facial swelling. Patient concerned about possibly peanut allergy as she had eaten peanuts the day before onset. She may have also taken additional lisinopril tablets during these episodes. 1-2 months prior started to have random hives with no triggers.   Today's skin testing showed: Negative to indoor/outdoor allergens, peanuts, tree nuts.   Stop lisinopril as it can sometimes cause swelling and the dry cough.  Contact PCP about starting a different medication for blood pressure - no ace inhibitors.  Next week - may reintroduce peanuts and tree nuts back into diet slowly. Based on clinical history and negative skin testing results unlikely to be the cause.  Marland Kitchen Avoid the following potential triggers: alcohol, tight clothing, NSAIDs, hot showers and getting overheated. . If you notice any hives/rash then restart loratadine 10mg  daily. . Get bloodwork to rule out other etiologies.

## 2021-02-11 NOTE — Assessment & Plan Note (Signed)
.   See assessment and plan as above. 

## 2021-02-11 NOTE — Patient Instructions (Addendum)
Today's skin testing showed: Negative to indoor/outdoor allergens, peanuts, tree nuts.   Swelling:  Stop lisinopril as it can sometimes cause swelling and the dry cough.  Contact PCP about starting a different medication for blood pressure - no ace inhibitors.  Next week - may try to reintroduce peanuts and tree nuts back into your diet slowly.   Rash: . Avoid the following potential triggers: alcohol, tight clothing, NSAIDs, hot showers and getting overheated. . If you notice any hives/rash then you may restart loratadine 10mg  daily.   . Get bloodwork:  o We are ordering labs, so please allow 1-2 weeks for the results to come back. o With the newly implemented Cures Act, the labs might be visible to you at the same time that they become visible to me. However, I will not address the results until all of the results are back, so please be patient.    Follow up in 4 months or sooner if needed.

## 2021-02-12 ENCOUNTER — Telehealth: Payer: Self-pay | Admitting: *Deleted

## 2021-02-12 DIAGNOSIS — I10 Essential (primary) hypertension: Secondary | ICD-10-CM

## 2021-02-12 MED ORDER — HYDROCHLOROTHIAZIDE 25 MG PO TABS: 25.0000 mg | ORAL_TABLET | Freq: Every day | ORAL | 2 refills | Status: DC

## 2021-02-12 NOTE — Telephone Encounter (Signed)
New BP meds sent.  Follow-up in 2 to 3 weeks for nurse visit.  We will need a BMP at that time.  Medication sent at below:   Meds ordered this encounter  Medications  . hydrochlorothiazide (HYDRODIURIL) 25 MG tablet    Sig: Take 1 tablet (25 mg total) by mouth daily.    Dispense:  30 tablet    Refill:  2   @T @   Beatrice Lecher, MD

## 2021-02-12 NOTE — Telephone Encounter (Signed)
Pt called and was seen by Dr. Maudie Mercury [facility] yesterday and it was advised that she stop the Lisinopril due to it could possibly cause swelling and a dry cough. She was told to contact Dr. Madilyn Fireman and advise her of this and ask her to about starting her on a different medication to control her bp -NO ACE INHIBITORS.   Lisinopril added to allergy list.   Will fwd to pcp for review

## 2021-02-23 NOTE — Telephone Encounter (Signed)
Pt advised appt scheduled lab ordered.

## 2021-03-04 ENCOUNTER — Ambulatory Visit (INDEPENDENT_AMBULATORY_CARE_PROVIDER_SITE_OTHER): Payer: BC Managed Care – PPO | Admitting: Family Medicine

## 2021-03-04 ENCOUNTER — Other Ambulatory Visit: Payer: Self-pay

## 2021-03-04 VITALS — BP 109/71 | HR 89

## 2021-03-04 DIAGNOSIS — I10 Essential (primary) hypertension: Secondary | ICD-10-CM | POA: Diagnosis not present

## 2021-03-04 LAB — BASIC METABOLIC PANEL
BUN: 10 mg/dL (ref 7–25)
CO2: 30 mmol/L (ref 20–32)
Calcium: 9.9 mg/dL (ref 8.6–10.2)
Chloride: 101 mmol/L (ref 98–110)
Creat: 0.91 mg/dL (ref 0.50–1.10)
Glucose, Bld: 90 mg/dL (ref 65–99)
Potassium: 4 mmol/L (ref 3.5–5.3)
Sodium: 140 mmol/L (ref 135–146)

## 2021-03-04 NOTE — Progress Notes (Signed)
Established Patient Office Visit  Subjective:  Patient ID: Kayla Munoz, female    DOB: July 14, 1976  Age: 45 y.o. MRN: [id number]  CC:  Chief Complaint  Patient presents with   Hypertension    HPI Bettyjo Lundblad presents for blood pressure check. Denies chest pain, shortness of breath or dizziness.   Past Medical History:  Diagnosis Date   Anemia    Angio-edema    Essential hypertension 10/15/2019   Fibroids    Urticaria    Vitamin D deficiency 11/26/2018    Past Surgical History:  Procedure Laterality Date   UTERINE FIBROID SURGERY  04/2011    Family History  Problem Relation Age of Onset   Diabetes Father    Hypertension Father    Kidney disease Father    Allergic rhinitis Father    Asthma Father    Diabetes Mother    Heart disease Mother    Hypertension Mother    Heart failure Mother    Parkinson's disease Paternal Uncle    Stomach cancer Paternal Uncle    Breast cancer Neg Hx    Colon cancer Neg Hx    Ovarian cancer Neg Hx    Eczema Neg Hx    Urticaria Neg Hx     Social History   Socioeconomic History   Marital status: Single    Spouse name: Not on file   Number of children: Not on file   Years of education: Not on file   Highest education level: Not on file  Occupational History   Not on file  Tobacco Use   Smoking status: Former    Packs/day: 0.50    Years: 12.00    Pack years: 6.00    Types: Cigarettes    Quit date: 05/19/2014    Years since quitting: 6.7   Smokeless tobacco: Never  Vaping Use   Vaping Use: Never used  Substance and Sexual Activity   Alcohol use: Yes    Alcohol/week: 7.0 standard drinks    Types: 7 Standard drinks or equivalent per week   Drug use: Not Currently    Types: Marijuana    Comment: quit cold [place] 07/2019, previously smoked 1-2 times daily   Sexual activity: Not Currently    Birth control/protection: I.U.D.  Other Topics Concern   Not on file  Social History Narrative   Right handed   Lives in one  story house    Drinks caffeine   Social Determinants of Health   Financial Resource Strain: Not on file  Food Insecurity: Not on file  Transportation Needs: Not on file  Physical Activity: Not on file  Stress: Not on file  Social Connections: Not on file  Intimate Partner Violence: Not on file    Outpatient Medications Prior to Visit  Medication Sig Dispense Refill   hydrochlorothiazide (HYDRODIURIL) 25 MG tablet Take 1 tablet (25 mg total) by mouth daily. 30 tablet 2   ketoconazole (NIZORAL) 2 % shampoo WASH AFFECTED AREAS ON SCALP ONCE A DAY 120 mL PRN   Levonorgestrel 13.5 MG IUD by Intrauterine route.     No facility-administered medications prior to visit.    Allergies  Allergen Reactions   Lisinopril Swelling    ROS Review of Systems    Objective:    Physical Exam  BP 109/71   Pulse 89   SpO2 100%  Wt Readings from Last 3 Encounters:  02/11/21 180 lb (81.6 kg)  10/26/20 194 lb (88 kg)  04/09/20 192 lb (87.1 kg)  Health Maintenance Due  Topic Date Due   COVID-19 Vaccine (2 - Booster for Janssen series) 02/01/2020    There are no preventive care reminders to display for this patient.  Lab Results  Component Value Date   TSH 0.50 01/25/2016   Lab Results  Component Value Date   WBC 8.9 10/26/2020   HGB 14.6 10/26/2020   HCT 42.5 10/26/2020   MCV 96.4 10/26/2020   PLT 293 10/26/2020   Lab Results  Component Value Date   NA 139 10/26/2020   K 3.8 10/26/2020   CO2 29 10/26/2020   GLUCOSE 91 10/26/2020   BUN 9 10/26/2020   CREATININE 0.74 10/26/2020   BILITOT 0.5 10/26/2020   ALKPHOS 35 06/06/2016   AST 20 10/26/2020   ALT 18 10/26/2020   PROT 7.7 10/26/2020   ALBUMIN 3.9 06/06/2016   CALCIUM 9.8 10/26/2020   Lab Results  Component Value Date   CHOL 162 10/26/2020   Lab Results  Component Value Date   HDL 64 10/26/2020   Lab Results  Component Value Date   LDLCALC 85 10/26/2020   Lab Results  Component Value Date   TRIG  51 10/26/2020   Lab Results  Component Value Date   CHOLHDL 2.5 10/26/2020   Lab Results  Component Value Date   HGBA1C 5.3 06/06/2016      Assessment & Plan:  HTN - Patient advised to continue current medications as directed. Also advised to have labs drawn. Patient advised  to follow up in 3 months with Dr Madilyn Fireman.   Problem List Items Addressed This Visit     Essential hypertension - Primary    No orders of the defined types were placed in this encounter.   Follow-up: Return in about 3 months (around 06/04/2021) for HTN with Dr Madilyn Fireman. Durene Romans, Monico Blitz, [place]

## 2021-03-04 NOTE — Progress Notes (Signed)
Hypertension-lisinopril was discontinued secondary to swelling and cough as recommended by allergy and asthma.  We had switched her to hydrochlorothiazide she is tolerating it well.  Blood pressure looks beautiful.  Due for BMP. Plesae make sure she has drawn today.   Kayla Lecher, MD

## 2021-03-04 NOTE — Progress Notes (Signed)
Patient had labs drawn today.

## 2021-03-05 NOTE — Progress Notes (Signed)
All labs are normal. 

## 2021-03-10 ENCOUNTER — Other Ambulatory Visit: Payer: Self-pay | Admitting: Family Medicine

## 2021-03-12 ENCOUNTER — Telehealth: Payer: Self-pay | Admitting: *Deleted

## 2021-03-12 MED ORDER — AMLODIPINE BESYLATE 5 MG PO TABS: 5.0000 mg | ORAL_TABLET | Freq: Every day | ORAL | 3 refills | Status: DC

## 2021-03-12 NOTE — Telephone Encounter (Signed)
Pt lvm stating that the HCTZ is causing her to have some episodes of dizziness and she noticed that she has been breaking out in hives on and off from this medication.   She is asking that this be switched to something else.

## 2021-03-12 NOTE — Telephone Encounter (Signed)
Prescription sent for amlodipine.  Have her schedule follow-up with me in about 3 weeks.  Meds ordered this encounter  Medications   amLODipine (NORVASC) 5 MG tablet    Sig: Take 1 tablet (5 mg total) by mouth daily.    Dispense:  30 tablet    Refill:  3

## 2021-03-16 ENCOUNTER — Other Ambulatory Visit: Payer: Self-pay

## 2021-03-16 ENCOUNTER — Ambulatory Visit
Admission: RE | Admit: 2021-03-16 | Discharge: 2021-03-16 | Disposition: A | Discharge status: Home / Self Care | Payer: BC Managed Care – PPO | Source: Ambulatory Visit | Attending: Family Medicine | Admitting: Family Medicine

## 2021-03-16 DIAGNOSIS — Z1231 Encounter for screening mammogram for malignant neoplasm of breast: Secondary | ICD-10-CM

## 2021-03-16 NOTE — Telephone Encounter (Signed)
Patient advised and scheduled.  

## 2021-04-07 ENCOUNTER — Other Ambulatory Visit: Payer: Self-pay

## 2021-04-07 ENCOUNTER — Ambulatory Visit: Payer: BC Managed Care – PPO | Admitting: Family Medicine

## 2021-04-07 ENCOUNTER — Encounter: Payer: Self-pay | Admitting: Family Medicine

## 2021-04-07 VITALS — BP 123/76 | HR 72 | Ht 63.0 in | Wt 198.0 lb

## 2021-04-07 DIAGNOSIS — I10 Essential (primary) hypertension: Secondary | ICD-10-CM | POA: Diagnosis not present

## 2021-04-07 DIAGNOSIS — T50905A Adverse effect of unspecified drugs, medicaments and biological substances, initial encounter: Secondary | ICD-10-CM | POA: Diagnosis not present

## 2021-04-07 NOTE — Progress Notes (Signed)
Established Patient Office Visit  Subjective:  Patient ID: Kayla Munoz, female    DOB: Jan 14, 1976  Age: 45 y.o. MRN: [id number]  CC:  Chief Complaint  Patient presents with   Hypertension    Pt reports that she d/c'd Amlodipine 1 wk ago due to causing ankle swelling    HPI Kayla Munoz presents for BP.   She actually stopped her amlodipine about a week ago because of ankle swelling bilaterally though it was worse on her left compared to her right.  She says the swelling has gotten much better since stopping the medication.  She says she has not had the weekly headaches since coming off of the medication which she was having previously when her blood pressure was uncontrolled.  She has been trying to eat more healthy and eat things like eat oatmeal for breakfast.  She is also been running for exercise.  Even though her weight did go up since I last saw her.  In general her headaches have been better.  She was noting that her Sproat sports bra was fitting a little bit more tightly across her upper back and shoulders and was causing some symptoms and she is remedied that since.  Past Medical History:  Diagnosis Date   Anemia    Angio-edema    Essential hypertension 10/15/2019   Fibroids    Urticaria    Vitamin D deficiency 11/26/2018    Past Surgical History:  Procedure Laterality Date   UTERINE FIBROID SURGERY  04/2011    Family History  Problem Relation Age of Onset   Diabetes Father    Hypertension Father    Kidney disease Father    Allergic rhinitis Father    Asthma Father    Diabetes Mother    Heart disease Mother    Hypertension Mother    Heart failure Mother    Parkinson's disease Paternal Uncle    Stomach cancer Paternal Uncle    Breast cancer Neg Hx    Colon cancer Neg Hx    Ovarian cancer Neg Hx    Eczema Neg Hx    Urticaria Neg Hx     Social History   Socioeconomic History   Marital status: Single    Spouse name: Not on file   Number of children:  Not on file   Years of education: Not on file   Highest education level: Not on file  Occupational History   Not on file  Tobacco Use   Smoking status: Former    Packs/day: 0.50    Years: 12.00    Pack years: 6.00    Types: Cigarettes    Quit date: 05/19/2014    Years since quitting: 6.8   Smokeless tobacco: Never  Vaping Use   Vaping Use: Never used  Substance and Sexual Activity   Alcohol use: Yes    Alcohol/week: 7.0 standard drinks    Types: 7 Standard drinks or equivalent per week   Drug use: Not Currently    Types: Marijuana    Comment: quit cold [place] 07/2019, previously smoked 1-2 times daily   Sexual activity: Not Currently    Birth control/protection: I.U.D.  Other Topics Concern   Not on file  Social History Narrative   Right handed   Lives in one story house    Drinks caffeine   Social Determinants of Health   Financial Resource Strain: Not on file  Food Insecurity: Not on file  Transportation Needs: Not on file  Physical Activity: Not  on file  Stress: Not on file  Social Connections: Not on file  Intimate Partner Violence: Not on file    Outpatient Medications Prior to Visit  Medication Sig Dispense Refill   ketoconazole (NIZORAL) 2 % shampoo WASH AFFECTED AREAS ON SCALP ONCE A DAY 120 mL PRN   Levonorgestrel 13.5 MG IUD by Intrauterine route.     amLODipine (NORVASC) 5 MG tablet Take 1 tablet (5 mg total) by mouth daily. 30 tablet 3   No facility-administered medications prior to visit.    Allergies  Allergen Reactions   Hctz [Hydrochlorothiazide] Hives   Lisinopril Swelling   Amlodipine Swelling    Ankle swelling     ROS Review of Systems    Objective:    Physical Exam Constitutional:      Appearance: Normal appearance. She is well-developed.  HENT:     Head: Normocephalic and atraumatic.  Cardiovascular:     Rate and Rhythm: Normal rate and regular rhythm.     Heart sounds: Normal heart sounds.  Pulmonary:     Effort:  Pulmonary effort is normal.     Breath sounds: Normal breath sounds.  Skin:    General: Skin is warm and dry.  Neurological:     Mental Status: She is alert and oriented to person, place, and time.  Psychiatric:        Behavior: Behavior normal.    BP 123/76   Pulse 72   Ht 5\' 3"  (1.6 m)   Wt 198 lb (89.8 kg)   SpO2 99%   BMI 35.07 kg/m  Wt Readings from Last 3 Encounters:  04/07/21 198 lb (89.8 kg)  02/11/21 180 lb (81.6 kg)  10/26/20 194 lb (88 kg)     Health Maintenance Due  Topic Date Due   COVID-19 Vaccine (2 - Booster for Janssen series) 02/01/2020    There are no preventive care reminders to display for this patient.  Lab Results  Component Value Date   TSH 0.50 01/25/2016   Lab Results  Component Value Date   WBC 8.9 10/26/2020   HGB 14.6 10/26/2020   HCT 42.5 10/26/2020   MCV 96.4 10/26/2020   PLT 293 10/26/2020   Lab Results  Component Value Date   NA 140 03/04/2021   K 4.0 03/04/2021   CO2 30 03/04/2021   GLUCOSE 90 03/04/2021   BUN 10 03/04/2021   CREATININE 0.91 03/04/2021   BILITOT 0.5 10/26/2020   ALKPHOS 35 06/06/2016   AST 20 10/26/2020   ALT 18 10/26/2020   PROT 7.7 10/26/2020   ALBUMIN 3.9 06/06/2016   CALCIUM 9.9 03/04/2021   Lab Results  Component Value Date   CHOL 162 10/26/2020   Lab Results  Component Value Date   HDL 64 10/26/2020   Lab Results  Component Value Date   LDLCALC 85 10/26/2020   Lab Results  Component Value Date   TRIG 51 10/26/2020   Lab Results  Component Value Date   CHOLHDL 2.5 10/26/2020   Lab Results  Component Value Date   HGBA1C 5.3 06/06/2016      Assessment & Plan:   Problem List Items Addressed This Visit       Cardiovascular and Mediastinum   Essential hypertension    Blood pressure actually looks great today.  She is exercising and eating more healthy which I do think is probably contributing did go ahead and add amlodipine to her intolerance list.  And encouraged her to just  keep an eye  on her blood pressure at home if she notices it starting to elevate then please give [place] a call and we can try different medication.  She will let me know       Other Visit Diagnoses     Medication side effect, initial encounter    -  Primary       No orders of the defined types were placed in this encounter.   Follow-up: Return in about 6 months (around 10/08/2021).    Beatrice Lecher, MD

## 2021-04-07 NOTE — Progress Notes (Signed)
Pt reports ankle swelling. She stopped taking the Amlodipine 1 wk ago and the swelling went down. No other problems.

## 2021-04-07 NOTE — Assessment & Plan Note (Signed)
Blood pressure actually looks great today.  She is exercising and eating more healthy which I do think is probably contributing did go ahead and add amlodipine to her intolerance list.  And encouraged her to just keep an eye on her blood pressure at home if she notices it starting to elevate then please give [place] a call and we can try different medication.  She will let me know

## 2021-04-22 ENCOUNTER — Ambulatory Visit: Payer: BC Managed Care – PPO | Admitting: Family Medicine

## 2021-05-11 ENCOUNTER — Other Ambulatory Visit: Payer: Self-pay | Admitting: Family Medicine

## 2021-06-07 ENCOUNTER — Ambulatory Visit: Payer: BC Managed Care – PPO | Admitting: Family Medicine

## 2021-06-07 NOTE — Progress Notes (Deleted)
Established Patient Office Visit  Subjective:  Patient ID: Kayla Munoz, female    DOB: 03/12/1976  Age: 45 y.o. MRN: [id number]  CC: No chief complaint on file.   HPI Keyia Shayne presents for   Hypertension- Pt denies chest pain, SOB, dizziness, or heart palpitations.  Taking meds as directed w/o problems.  Denies medication side effects.     Past Medical History:  Diagnosis Date   Anemia    Angio-edema    Essential hypertension 10/15/2019   Fibroids    Urticaria    Vitamin D deficiency 11/26/2018    Past Surgical History:  Procedure Laterality Date   UTERINE FIBROID SURGERY  04/2011    Family History  Problem Relation Age of Onset   Diabetes Father    Hypertension Father    Kidney disease Father    Allergic rhinitis Father    Asthma Father    Diabetes Mother    Heart disease Mother    Hypertension Mother    Heart failure Mother    Parkinson's disease Paternal Uncle    Stomach cancer Paternal Uncle    Breast cancer Neg Hx    Colon cancer Neg Hx    Ovarian cancer Neg Hx    Eczema Neg Hx    Urticaria Neg Hx     Social History   Socioeconomic History   Marital status: Single    Spouse name: Not on file   Number of children: Not on file   Years of education: Not on file   Highest education level: Not on file  Occupational History   Not on file  Tobacco Use   Smoking status: Former    Packs/day: 0.50    Years: 12.00    Pack years: 6.00    Types: Cigarettes    Quit date: 05/19/2014    Years since quitting: 7.0   Smokeless tobacco: Never  Vaping Use   Vaping Use: Never used  Substance and Sexual Activity   Alcohol use: Yes    Alcohol/week: 7.0 standard drinks    Types: 7 Standard drinks or equivalent per week   Drug use: Not Currently    Types: Marijuana    Comment: quit cold [place] 07/2019, previously smoked 1-2 times daily   Sexual activity: Not Currently    Birth control/protection: I.U.D.  Other Topics Concern   Not on file  Social  History Narrative   Right handed   Lives in one story house    Drinks caffeine   Social Determinants of Health   Financial Resource Strain: Not on file  Food Insecurity: Not on file  Transportation Needs: Not on file  Physical Activity: Not on file  Stress: Not on file  Social Connections: Not on file  Intimate Partner Violence: Not on file    Outpatient Medications Prior to Visit  Medication Sig Dispense Refill   ketoconazole (NIZORAL) 2 % shampoo WASH AFFECTED AREAS ON SCALP ONCE A DAY 120 mL PRN   Levonorgestrel 13.5 MG IUD by Intrauterine route.     No facility-administered medications prior to visit.    Allergies  Allergen Reactions   Hctz [Hydrochlorothiazide] Hives   Lisinopril Swelling   Amlodipine Swelling    Ankle swelling     ROS Review of Systems    Objective:    Physical Exam  There were no vitals taken for this visit. Wt Readings from Last 3 Encounters:  04/07/21 198 lb (89.8 kg)  02/11/21 180 lb (81.6 kg)  10/26/20 194 lb (  88 kg)     Health Maintenance Due  Topic Date Due   COVID-19 Vaccine (2 - Booster for Janssen series) 02/01/2020   INFLUENZA VACCINE  04/26/2021    There are no preventive care reminders to display for this patient.  Lab Results  Component Value Date   TSH 0.50 01/25/2016   Lab Results  Component Value Date   WBC 8.9 10/26/2020   HGB 14.6 10/26/2020   HCT 42.5 10/26/2020   MCV 96.4 10/26/2020   PLT 293 10/26/2020   Lab Results  Component Value Date   NA 140 03/04/2021   K 4.0 03/04/2021   CO2 30 03/04/2021   GLUCOSE 90 03/04/2021   BUN 10 03/04/2021   CREATININE 0.91 03/04/2021   BILITOT 0.5 10/26/2020   ALKPHOS 35 06/06/2016   AST 20 10/26/2020   ALT 18 10/26/2020   PROT 7.7 10/26/2020   ALBUMIN 3.9 06/06/2016   CALCIUM 9.9 03/04/2021   Lab Results  Component Value Date   CHOL 162 10/26/2020   Lab Results  Component Value Date   HDL 64 10/26/2020   Lab Results  Component Value Date    LDLCALC 85 10/26/2020   Lab Results  Component Value Date   TRIG 51 10/26/2020   Lab Results  Component Value Date   CHOLHDL 2.5 10/26/2020   Lab Results  Component Value Date   HGBA1C 5.3 06/06/2016      Assessment & Plan:   Problem List Items Addressed This Visit       Cardiovascular and Mediastinum   Essential hypertension - Primary    No orders of the defined types were placed in this encounter.   Follow-up: No follow-ups on file.    Beatrice Lecher, MD

## 2021-06-08 ENCOUNTER — Other Ambulatory Visit: Payer: Self-pay | Admitting: Family Medicine

## 2021-06-15 ENCOUNTER — Ambulatory Visit: Payer: BC Managed Care – PPO | Admitting: Allergy

## 2021-06-23 ENCOUNTER — Other Ambulatory Visit: Payer: Self-pay

## 2021-06-23 ENCOUNTER — Encounter: Payer: Self-pay | Admitting: Family Medicine

## 2021-06-23 ENCOUNTER — Ambulatory Visit: Payer: BC Managed Care – PPO | Admitting: Family Medicine

## 2021-06-23 VITALS — BP 118/80 | HR 80 | Ht 63.0 in | Wt 202.0 lb

## 2021-06-23 DIAGNOSIS — R030 Elevated blood-pressure reading, without diagnosis of hypertension: Secondary | ICD-10-CM

## 2021-06-23 DIAGNOSIS — E236 Other disorders of pituitary gland: Secondary | ICD-10-CM | POA: Diagnosis not present

## 2021-06-23 NOTE — Assessment & Plan Note (Signed)
She is actually done fantastic off of the amlodipine.  She has been exercising regularly and trying to eat healthy.  Her weight has not changed much but she is doing well in fact her blood pressure is really well controlled.  We will continue to monitor carefully.

## 2021-06-23 NOTE — Progress Notes (Signed)
Established Patient Office Visit  Subjective:  Patient ID: Kayla Munoz, female    DOB: 26-May-1976  Age: 45 y.o. MRN: [id number]  CC:  Chief Complaint  Patient presents with   Hypertension    HPI Kayla Munoz presents for   Hypertension- Pt denies chest pain, SOB, dizziness, or heart palpitations.  Taking meds as directed w/o problems.  Denies medication side effects.  We actually discontinued her amlodipine at last office visit because of swelling.  swelling did improve after discontinuing the medication.  Exercising 3 days per weeks.     Past Medical History:  Diagnosis Date   Anemia    Angio-edema    Essential hypertension 10/15/2019   Fibroids    Urticaria    Vitamin D deficiency 11/26/2018    Past Surgical History:  Procedure Laterality Date   UTERINE FIBROID SURGERY  04/2011    Family History  Problem Relation Age of Onset   Diabetes Father    Hypertension Father    Kidney disease Father    Allergic rhinitis Father    Asthma Father    Diabetes Mother    Heart disease Mother    Hypertension Mother    Heart failure Mother    Parkinson's disease Paternal Uncle    Stomach cancer Paternal Uncle    Breast cancer Neg Hx    Colon cancer Neg Hx    Ovarian cancer Neg Hx    Eczema Neg Hx    Urticaria Neg Hx     Social History   Socioeconomic History   Marital status: Single    Spouse name: Not on file   Number of children: Not on file   Years of education: Not on file   Highest education level: Not on file  Occupational History   Not on file  Tobacco Use   Smoking status: Former    Packs/day: 0.50    Years: 12.00    Pack years: 6.00    Types: Cigarettes    Quit date: 05/19/2014    Years since quitting: 7.1   Smokeless tobacco: Never  Vaping Use   Vaping Use: Never used  Substance and Sexual Activity   Alcohol use: Yes    Alcohol/week: 7.0 standard drinks    Types: 7 Standard drinks or equivalent per week   Drug use: Not Currently    Types:  Marijuana    Comment: quit cold [place] 07/2019, previously smoked 1-2 times daily   Sexual activity: Not Currently    Birth control/protection: I.U.D.  Other Topics Concern   Not on file  Social History Narrative   Right handed   Lives in one story house    Drinks caffeine   Social Determinants of Health   Financial Resource Strain: Not on file  Food Insecurity: Not on file  Transportation Needs: Not on file  Physical Activity: Not on file  Stress: Not on file  Social Connections: Not on file  Intimate Partner Violence: Not on file    Outpatient Medications Prior to Visit  Medication Sig Dispense Refill   ketoconazole (NIZORAL) 2 % shampoo WASH AFFECTED AREAS ON SCALP ONCE A DAY 120 mL PRN   Levonorgestrel 13.5 MG IUD by Intrauterine route.     No facility-administered medications prior to visit.    Allergies  Allergen Reactions   Hctz [Hydrochlorothiazide] Hives   Lisinopril Hives and Swelling    Angioedema   Amlodipine Swelling    Ankle swelling     ROS Review of Systems  Objective:    Physical Exam Constitutional:      Appearance: Normal appearance. She is well-developed.  HENT:     Head: Normocephalic and atraumatic.  Cardiovascular:     Rate and Rhythm: Normal rate and regular rhythm.     Heart sounds: Normal heart sounds.  Pulmonary:     Effort: Pulmonary effort is normal.     Breath sounds: Normal breath sounds.  Skin:    General: Skin is warm and dry.  Neurological:     Mental Status: She is alert and oriented to person, place, and time.  Psychiatric:        Behavior: Behavior normal.    BP 118/80   Pulse 80   Ht 5\' 3"  (1.6 m)   Wt 202 lb (91.6 kg)   SpO2 100%   BMI 35.78 kg/m  Wt Readings from Last 3 Encounters:  06/23/21 202 lb (91.6 kg)  04/07/21 198 lb (89.8 kg)  02/11/21 180 lb (81.6 kg)     There are no preventive care reminders to display for this patient.   There are no preventive care reminders to display for this  patient.  Lab Results  Component Value Date   TSH 0.50 01/25/2016   Lab Results  Component Value Date   WBC 8.9 10/26/2020   HGB 14.6 10/26/2020   HCT 42.5 10/26/2020   MCV 96.4 10/26/2020   PLT 293 10/26/2020   Lab Results  Component Value Date   NA 140 03/04/2021   K 4.0 03/04/2021   CO2 30 03/04/2021   GLUCOSE 90 03/04/2021   BUN 10 03/04/2021   CREATININE 0.91 03/04/2021   BILITOT 0.5 10/26/2020   ALKPHOS 35 06/06/2016   AST 20 10/26/2020   ALT 18 10/26/2020   PROT 7.7 10/26/2020   ALBUMIN 3.9 06/06/2016   CALCIUM 9.9 03/04/2021   Lab Results  Component Value Date   CHOL 162 10/26/2020   Lab Results  Component Value Date   HDL 64 10/26/2020   Lab Results  Component Value Date   LDLCALC 85 10/26/2020   Lab Results  Component Value Date   TRIG 51 10/26/2020   Lab Results  Component Value Date   CHOLHDL 2.5 10/26/2020   Lab Results  Component Value Date   HGBA1C 5.3 06/06/2016      Assessment & Plan:   Problem List Items Addressed This Visit       Musculoskeletal and Integument   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  They had recommended routine eye exams to look for papilledema.        Other   Elevated BP without diagnosis of hypertension - Primary    She is actually done fantastic off of the amlodipine.  She has been exercising regularly and trying to eat healthy.  Her weight has not changed much but she is doing well in fact her blood pressure is really well controlled.  We will continue to monitor carefully.       No orders of the defined types were placed in this encounter.   Follow-up: No follow-ups on file.    Beatrice Lecher, MD

## 2021-06-23 NOTE — Assessment & Plan Note (Signed)
Was evaluated by neurology about a year ago.  They had recommended routine eye exams to look for papilledema.

## 2021-07-15 ENCOUNTER — Ambulatory Visit: Payer: BC Managed Care – PPO | Admitting: Allergy

## 2022-04-19 ENCOUNTER — Other Ambulatory Visit: Payer: Self-pay | Admitting: Family Medicine

## 2022-04-19 DIAGNOSIS — Z1231 Encounter for screening mammogram for malignant neoplasm of breast: Secondary | ICD-10-CM

## 2022-04-22 ENCOUNTER — Ambulatory Visit: Payer: BC Managed Care – PPO

## 2022-05-03 ENCOUNTER — Ambulatory Visit: Payer: BC Managed Care – PPO

## 2022-05-04 ENCOUNTER — Ambulatory Visit
Admission: RE | Admit: 2022-05-04 | Discharge: 2022-05-04 | Disposition: A | Discharge status: Home / Self Care | Payer: BC Managed Care – PPO | Source: Ambulatory Visit | Attending: Family Medicine | Admitting: Family Medicine

## 2022-05-04 DIAGNOSIS — Z1231 Encounter for screening mammogram for malignant neoplasm of breast: Secondary | ICD-10-CM

## 2022-05-05 NOTE — Progress Notes (Signed)
Please call patient. Normal mammogram.  Repeat in 1 year.  

## 2022-06-01 ENCOUNTER — Encounter: Payer: Self-pay | Admitting: Family Medicine

## 2022-06-01 ENCOUNTER — Ambulatory Visit (INDEPENDENT_AMBULATORY_CARE_PROVIDER_SITE_OTHER): Payer: BC Managed Care – PPO | Admitting: Family Medicine

## 2022-06-01 VITALS — BP 142/94 | HR 83 | Temp 98.0°F | Ht 63.0 in | Wt 203.0 lb

## 2022-06-01 DIAGNOSIS — Z0000 Encounter for general adult medical examination without abnormal findings: Secondary | ICD-10-CM

## 2022-06-01 DIAGNOSIS — H65191 Other acute nonsuppurative otitis media, right ear: Secondary | ICD-10-CM

## 2022-06-01 DIAGNOSIS — Z124 Encounter for screening for malignant neoplasm of cervix: Secondary | ICD-10-CM

## 2022-06-01 NOTE — Progress Notes (Signed)
Complete physical exam  Patient: Kayla Munoz   DOB: 1975-12-05   46 y.o. Female  MRN: [id number]  Subjective:    Chief Complaint  Patient presents with   Annual Exam    Kayla Munoz is a 46 y.o. female who presents today for a complete physical exam. She reports consuming a general diet.  She does exercise regularly   She generally feels well. She reports sleeping fairly well. She does not have additional problems to discuss today.    Most recent fall risk assessment:    10/03/2019    3:05 PM  [place] in the past year? 0  Number falls in past yr: 0  Injury with Fall? 0     Most recent depression screenings:    06/23/2021   11:10 AM 04/09/2020    2:03 PM  PHQ 2/9 Scores  PHQ - 2 Score 0 0      Past Surgical History:  Procedure Laterality Date   UTERINE FIBROID SURGERY  04/2011   Social History   Tobacco Use   Smoking status: Former    Packs/day: 0.50    Years: 12.00    Total pack years: 6.00    Types: Cigarettes    Quit date: 05/19/2014    Years since quitting: 8.0   Smokeless tobacco: Never  Vaping Use   Vaping Use: Never used  Substance Use Topics   Alcohol use: Yes    Alcohol/week: 7.0 standard drinks of alcohol    Types: 7 Standard drinks or equivalent per week   Drug use: Not Currently    Types: Marijuana    Comment: quit cold [place] 07/2019, previously smoked 1-2 times daily   Allergies  Allergen Reactions   Hctz [Hydrochlorothiazide] Hives   Lisinopril Hives and Swelling    Angioedema   Amlodipine Swelling    Ankle swelling       Patient Care Team: [name], MD as PCP - Almond Lint, DO as Consulting Physician (Neurology)   Outpatient Medications Prior to Visit  Medication Sig   ketoconazole (NIZORAL) 2 % shampoo WASH AFFECTED AREAS ON SCALP ONCE A DAY   Levonorgestrel 13.5 MG IUD by Intrauterine route.   No facility-administered medications prior to visit.    ROS        Objective:      BP (!) 142/94 (BP Location: Left Arm, Patient Position: Sitting, Cuff Size: Normal)   Pulse 83   Temp 98 F (36.7 C) (Oral)   Ht '5\' 3"'$  (1.6 m)   Wt 203 lb 0.6 oz (92.1 kg)   SpO2 99%   BMI 35.97 kg/m     Physical Exam Vitals and nursing note reviewed.  Constitutional:      Appearance: She is well-developed.  HENT:     Head: Normocephalic and atraumatic.     Right Ear: Ear canal and external ear normal.     Left Ear: Tympanic membrane, ear canal and external ear normal.     Ears:     Comments: Fluide behind the right TM    Nose: Nose normal.  Eyes:     Conjunctiva/sclera: Conjunctivae normal.     Pupils: Pupils are equal, round, and reactive to light.  Neck:     Thyroid: No thyromegaly.  Cardiovascular:     Rate and Rhythm: Normal rate and regular rhythm.     Heart sounds: Normal heart sounds.  Pulmonary:     Effort: Pulmonary effort is normal.  Breath sounds: Normal breath sounds. No wheezing.  Musculoskeletal:     Cervical back: Neck supple.  Lymphadenopathy:     Cervical: No cervical adenopathy.  Skin:    General: Skin is warm and dry.  Neurological:     Mental Status: She is alert and oriented to person, place, and time.  Psychiatric:        Behavior: Behavior normal.      No results found for any visits on 06/01/22.     Assessment & Plan:    Routine Health Maintenance and Physical Exam  Immunization History  Administered Date(s) Administered   Influenza Whole 06/26/2013   Influenza,inj,Quad PF,6+ Mos 06/06/2016, 06/26/2017   Janssen (J&J) SARS-COV-2 Vaccination 12/07/2019   Moderna Sars-Covid-2 Vaccination 07/27/2020   Pfizer Covid-19 Vaccine Bivalent Booster 28yr & up 06/13/2021   Tdap 07/29/2013    Health Maintenance  Topic Date Due   INFLUENZA VACCINE  12/25/2022 (Originally 04/26/2022)   TETANUS/TDAP  07/30/2023   PAP SMEAR-Modifier  03/17/2024   COVID-19 Vaccine  Completed   Hepatitis C Screening  Completed   HIV Screening  Completed    Pneumococcal Vaccine [id number] old  Aged Out   HPV VACCINES  Aged Out    Discussed health benefits of physical activity, and encouraged her to engage in regular exercise appropriate for her age and condition.  Problem List Items Addressed This Visit   None Visit Diagnoses     Wellness examination    -  Primary   Relevant Orders   Lipid Panel w/reflex Direct LDL   COMPLETE METABOLIC PANEL WITH GFR   CBC   Acute middle ear effusion, right          Keep up a regular exercise program and make sure you are eating a healthy diet Try to eat 4 servings of dairy a day, or if you are lactose intolerant take a calcium with vitamin D daily.  Your vaccines are up to date.   Does have fluid behind the right TM-discussed restarting an oral antihistamine or nasal steroid spray such as Flonase or Nasonex for a couple of weeks to see if it improves.she did fly recently.   Return in about 1 year (around 06/02/2023) for Wellness Exam.     CBeatrice Lecher MD

## 2022-06-02 LAB — COMPLETE METABOLIC PANEL WITH GFR
AG Ratio: 1.5 (calc) (ref 1.0–2.5)
ALT: 18 U/L (ref 6–29)
AST: 23 U/L (ref 10–30)
Albumin: 4.4 g/dL (ref 3.6–5.1)
Alkaline phosphatase (APISO): 63 U/L (ref 31–125)
BUN: 11 mg/dL (ref 7–25)
CO2: 25 mmol/L (ref 20–32)
Calcium: 9.4 mg/dL (ref 8.6–10.2)
Chloride: 103 mmol/L (ref 98–110)
Creat: 0.82 mg/dL (ref 0.50–0.99)
Globulin: 2.9 g/dL (calc) (ref 1.9–3.7)
Glucose, Bld: 100 mg/dL — ABNORMAL HIGH (ref 65–99)
Potassium: 4.4 mmol/L (ref 3.5–5.3)
Sodium: 139 mmol/L (ref 135–146)
Total Bilirubin: 0.6 mg/dL (ref 0.2–1.2)
Total Protein: 7.3 g/dL (ref 6.1–8.1)
eGFR: 90 mL/min/{1.73_m2} (ref >=60)

## 2022-06-02 LAB — CBC
HCT: 42 % (ref 35.0–45.0)
Hemoglobin: 14.3 g/dL (ref 11.7–15.5)
MCH: 32.7 pg (ref 27.0–33.0)
MCHC: 34 g/dL (ref 32.0–36.0)
MCV: 96.1 fL (ref 80.0–100.0)
MPV: 10.1 fL (ref 7.5–12.5)
Platelets: 296 10*3/uL (ref 140–400)
RBC: 4.37 10*6/uL (ref 3.80–5.10)
RDW: 13.8 % (ref 11.0–15.0)
WBC: 6.1 10*3/uL (ref 3.8–10.8)

## 2022-06-02 LAB — LIPID PANEL W/REFLEX DIRECT LDL
Cholesterol: 172 mg/dL (ref <200)
HDL: 74 mg/dL (ref >=50)
LDL Cholesterol (Calc): 86 mg/dL (calc)
Non-HDL Cholesterol (Calc): 98 mg/dL (calc) (ref <130)
Total CHOL/HDL Ratio: 2.3 (calc) (ref <5.0)
Triglycerides: 50 mg/dL (ref <150)

## 2022-06-02 NOTE — Progress Notes (Signed)
Your lab work is within acceptable range and there are no concerning findings.   ?

## 2022-06-21 ENCOUNTER — Encounter: Payer: Self-pay | Admitting: Family Medicine

## 2022-06-21 DIAGNOSIS — N6489 Other specified disorders of breast: Secondary | ICD-10-CM

## 2023-05-10 ENCOUNTER — Other Ambulatory Visit: Payer: Self-pay | Admitting: Family Medicine

## 2023-05-10 DIAGNOSIS — Z1231 Encounter for screening mammogram for malignant neoplasm of breast: Secondary | ICD-10-CM

## 2023-05-14 IMAGING — MG MM DIGITAL SCREENING BILAT W/ TOMO AND CAD
8 series · 8 of 24 positions shown · non-contrast
Comparison: Previous exam(s).

CLINICAL DATA: Screening.

EXAM:
DIGITAL SCREENING BILATERAL MAMMOGRAM WITH TOMOSYNTHESIS AND CAD
TECHNIQUE: Bilateral screening digital craniocaudal and mediolateral oblique
mammograms were obtained. Bilateral screening digital breast
tomosynthesis was performed. The images were evaluated with
computer-aided detection.

[R CC synth-2D]
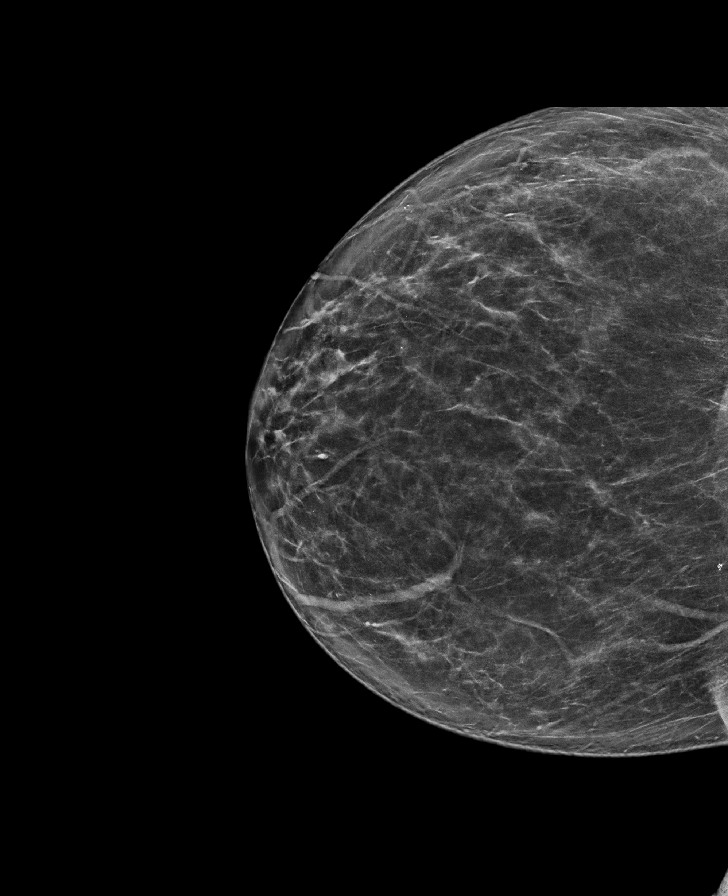

[L MLO synth-2D]
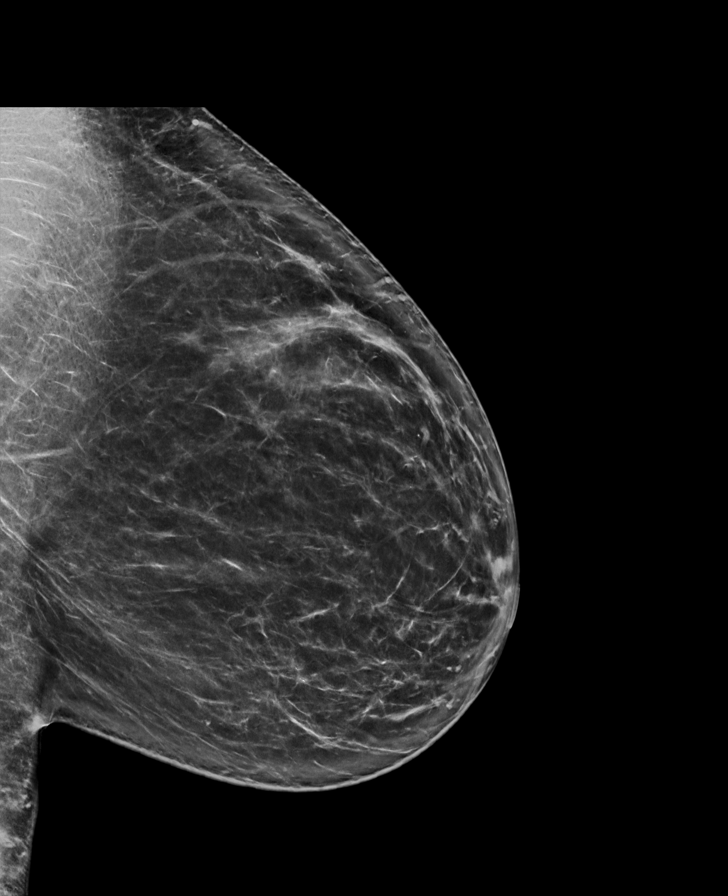

[R MLO synth-2D]
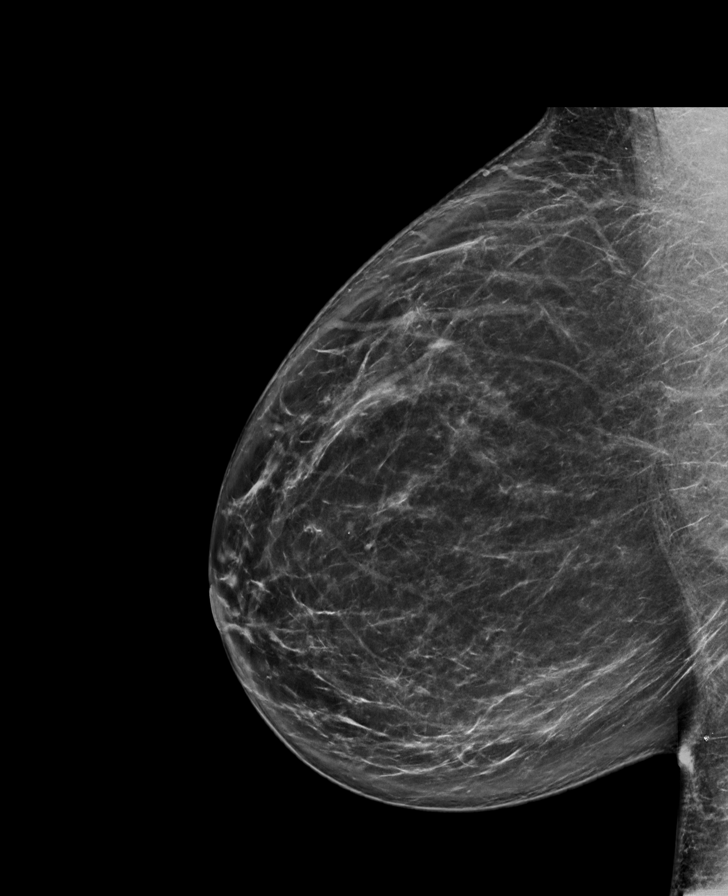

[L CC synth-2D]
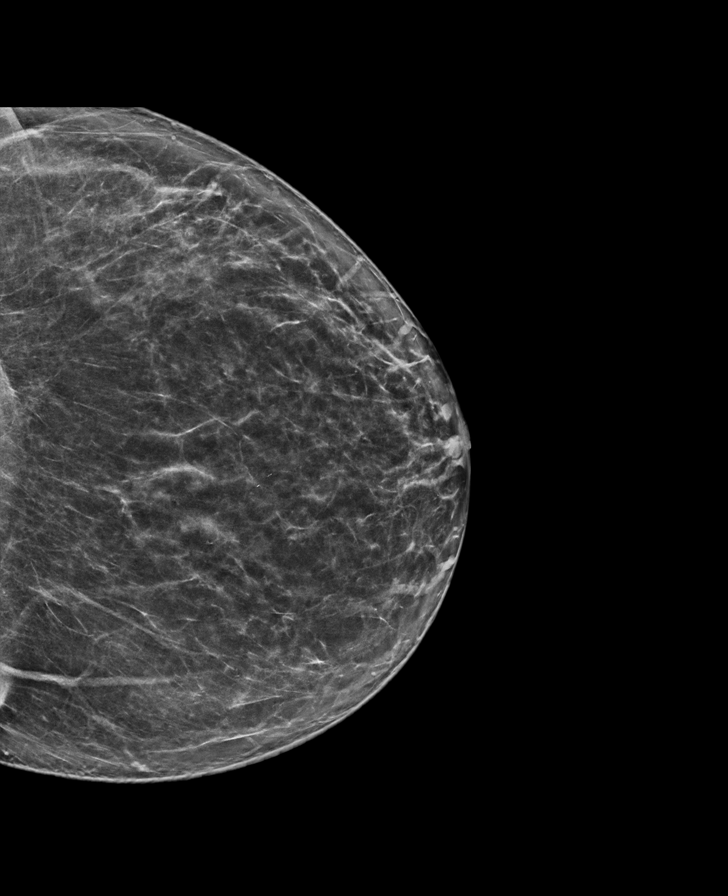

[L MLO tomo · tomo slice 47/94.0]
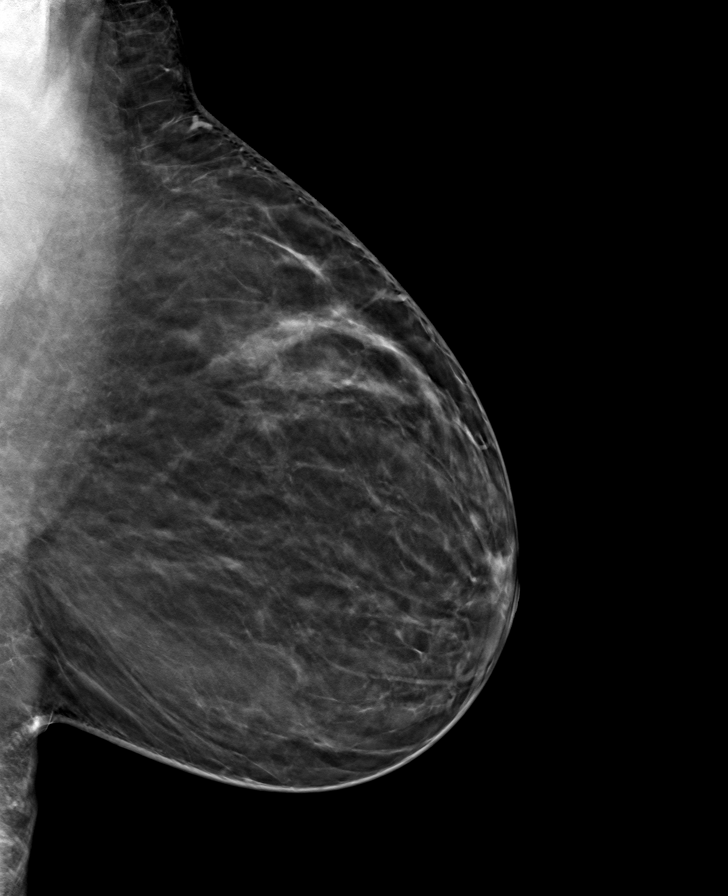

[R CC tomo · tomo slice 44/87.0]
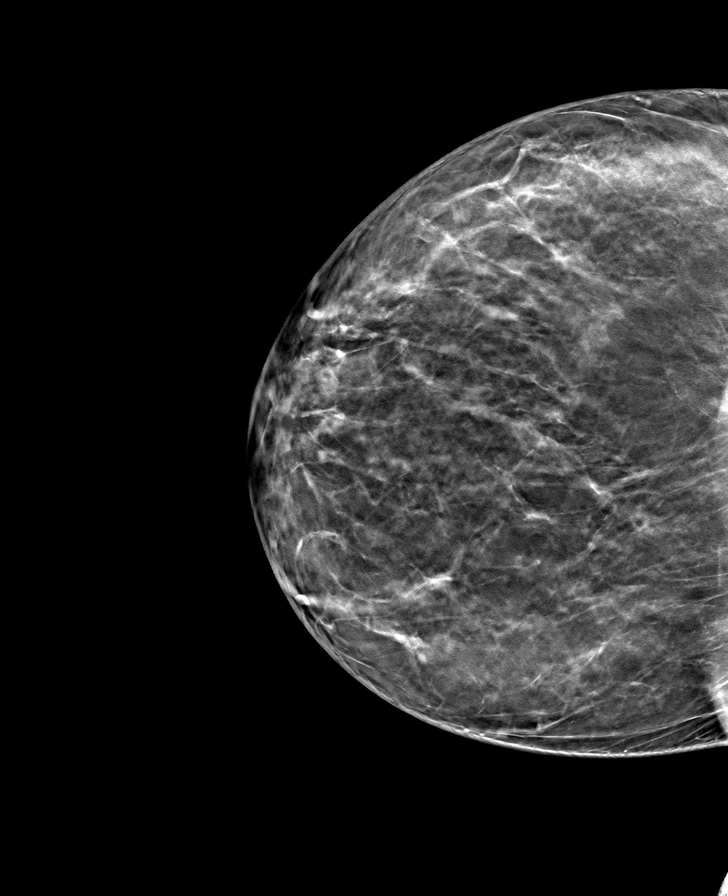

[L CC tomo · tomo slice 43/86.0]
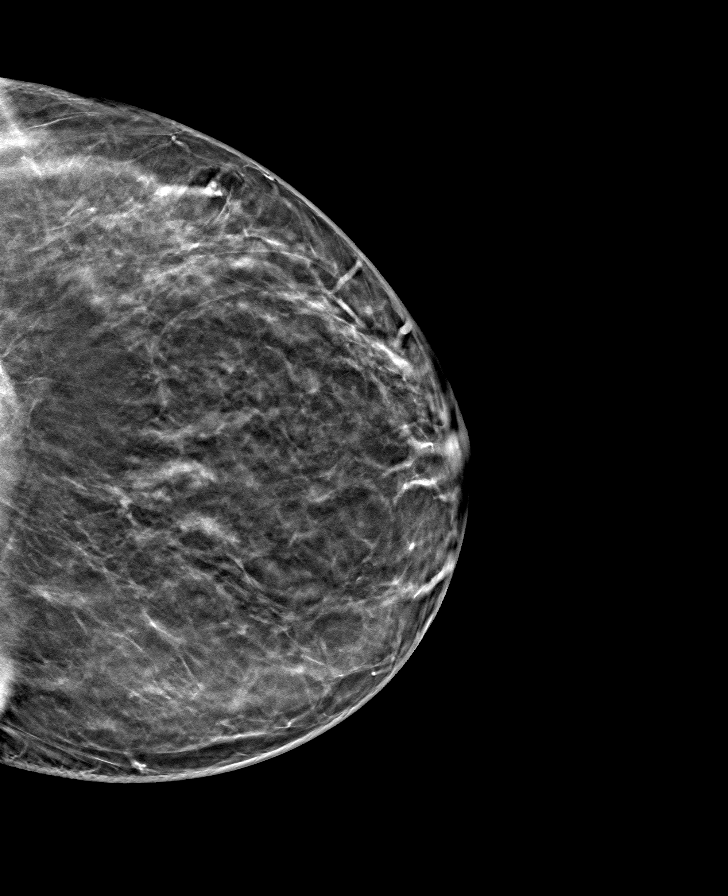

[R MLO tomo · tomo slice 46/91.0]
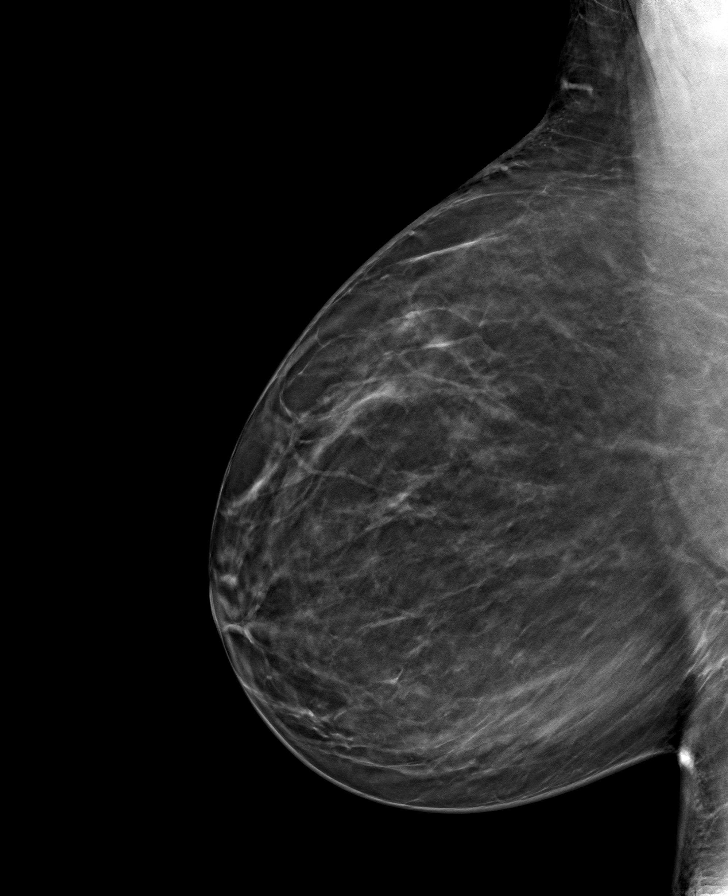

[8 of 24 positions shown; findings below may reference images not displayed]

ACR Breast Density Category b: There are scattered areas of
fibroglandular density.
FINDINGS: There are no findings suspicious for malignancy.
IMPRESSION: No mammographic evidence of malignancy. A result letter of this
screening mammogram will be mailed directly to the patient.

RECOMMENDATION:
Screening mammogram in one year. (Code:51-O-LD2)

BI-RADS CATEGORY  1: Negative.

## 2023-06-05 ENCOUNTER — Encounter: Payer: BC Managed Care – PPO | Admitting: Family Medicine

## 2023-06-07 ENCOUNTER — Ambulatory Visit
Admission: RE | Admit: 2023-06-07 | Discharge: 2023-06-07 | Disposition: A | Discharge status: Home / Self Care | Payer: BC Managed Care – PPO | Source: Ambulatory Visit | Attending: Family Medicine | Admitting: Family Medicine

## 2023-06-07 DIAGNOSIS — Z1231 Encounter for screening mammogram for malignant neoplasm of breast: Secondary | ICD-10-CM

## 2023-06-08 NOTE — Progress Notes (Signed)
Please call patient. Normal mammogram.  Repeat in 1 year.  

## 2023-06-28 ENCOUNTER — Ambulatory Visit (INDEPENDENT_AMBULATORY_CARE_PROVIDER_SITE_OTHER): Payer: BC Managed Care – PPO | Admitting: Family Medicine

## 2023-06-28 ENCOUNTER — Encounter: Payer: Self-pay | Admitting: Family Medicine

## 2023-06-28 VITALS — BP 129/86 | HR 85 | Ht 64.0 in | Wt 211.2 lb

## 2023-06-28 DIAGNOSIS — Z0000 Encounter for general adult medical examination without abnormal findings: Secondary | ICD-10-CM

## 2023-06-28 DIAGNOSIS — Z1211 Encounter for screening for malignant neoplasm of colon: Secondary | ICD-10-CM | POA: Diagnosis not present

## 2023-06-28 NOTE — Progress Notes (Signed)
Complete physical exam  Patient: Kayla Munoz   DOB: [birth year]   47 y.o. Female  MRN: [id number]  Subjective:    Chief Complaint  Patient presents with   Annual Exam    Kayla Munoz is a 47 y.o. female who presents today for a complete physical exam. She reports consuming a general diet.  Has started exercising routinely again with walking ane gym/weight training.   She generally feels well. She reports sleeping well. She does not have additional problems to discuss today.    Most recent fall risk assessment:    10/03/2019    3:05 PM  Fall Risk   Falls in the past year? 0  Number falls in past yr: 0  Injury with Fall? 0     Most recent depression screenings:    06/23/2021   11:10 AM 04/09/2020    2:03 PM  PHQ 2/9 Scores  PHQ - 2 Score 0 0        Patient Care Team: Agapito Games, MD as PCP - Sandria Senter, Rachelle Hora, DO as Consulting Physician (Neurology)   Outpatient Medications Prior to Visit  Medication Sig   ketoconazole (NIZORAL) 2 % shampoo WASH AFFECTED AREAS ON SCALP ONCE A DAY   Levonorgestrel 13.5 MG IUD by Intrauterine route.   No facility-administered medications prior to visit.    ROS        Objective:     BP 129/86   Pulse 85   Ht 5\' 4"  (1.626 m)   Wt 211 lb 4 oz (95.8 kg)   SpO2 99%   BMI 36.26 kg/m    Physical Exam Constitutional:      Appearance: Normal appearance.  HENT:     Head: Normocephalic and atraumatic.     Right Ear: Tympanic membrane, ear canal and external ear normal.     Left Ear: Tympanic membrane, ear canal and external ear normal.     Nose: Nose normal.     Mouth/Throat:     Pharynx: Oropharynx is clear.  Eyes:     Extraocular Movements: Extraocular movements intact.     Conjunctiva/sclera: Conjunctivae normal.     Pupils: Pupils are equal, round, and reactive to light.  Neck:     Thyroid: No thyromegaly.  Cardiovascular:     Rate and Rhythm: Normal rate and regular rhythm.  Pulmonary:      Effort: Pulmonary effort is normal.     Breath sounds: Normal breath sounds.  Abdominal:     General: Bowel sounds are normal.     Palpations: Abdomen is soft.     Tenderness: There is no abdominal tenderness.  Musculoskeletal:        General: No swelling.     Cervical back: Neck supple.  Skin:    General: Skin is warm and dry.  Neurological:     Mental Status: She is oriented to person, place, and time.  Psychiatric:        Mood and Affect: Mood normal.        Behavior: Behavior normal.      No results found for any visits on 06/28/23.     Assessment & Plan:    Routine Health Maintenance and Physical Exam  Immunization History  Administered Date(s) Administered   Influenza Whole 06/26/2013   Influenza,inj,Quad PF,6+ Mos 06/06/2016, 06/26/2017   Janssen (J&J) SARS-COV-2 Vaccination 12/07/2019   Moderna Sars-Covid-2 Vaccination 07/27/2020   Pfizer Covid-19 Vaccine Bivalent Booster 49yrs & up 06/13/2021   Tdap 07/29/2013  Health Maintenance  Topic Date Due   Colonoscopy  Never done   COVID-19 Vaccine (4 - 2023-24 season) 07/14/2023 (Originally 05/28/2023)   INFLUENZA VACCINE  12/25/2023 (Originally 04/27/2023)   DTaP/Tdap/Td (2 - Td or Tdap) 07/30/2023   Cervical Cancer Screening (HPV/Pap Cotest)  03/17/2024   Hepatitis C Screening  Completed   HIV Screening  Completed   Pneumococcal Vaccine 30-74 Years old  Aged Out   HPV VACCINES  Aged Out    Discussed health benefits of physical activity, and encouraged her to engage in regular exercise appropriate for her age and condition.  Problem List Items Addressed This Visit   None Visit Diagnoses     Wellness examination    -  Primary   Relevant Orders   CMP14+EGFR   Lipid Panel With LDL/HDL Ratio   CBC   Screen for colon cancer       Relevant Orders   Ambulatory referral to Gastroenterology      Return in about 1 year (around 06/27/2024) for Wellness Exam.  Keep up a regular exercise program and make sure you  are eating a healthy diet Try to eat 4 servings of dairy a day, or if you are lactose intolerant take a calcium with vitamin D daily.  Your vaccines are up to date.      Nani Gasser, MD

## 2023-06-29 LAB — CBC
Hematocrit: 40.7 % (ref 34.0–46.6)
Hemoglobin: 13.2 g/dL (ref 11.1–15.9)
MCH: 31.9 pg (ref 26.6–33.0)
MCHC: 32.4 g/dL (ref 31.5–35.7)
MCV: 98 fL — ABNORMAL HIGH (ref 79–97)
Platelets: 298 10*3/uL (ref 150–450)
RBC: 4.14 x10E6/uL (ref 3.77–5.28)
RDW: 13.7 % (ref 11.7–15.4)
WBC: 6.6 10*3/uL (ref 3.4–10.8)

## 2023-06-29 LAB — CMP14+EGFR
ALT: 19 [IU]/L (ref 0–32)
AST: 20 [IU]/L (ref 0–40)
Albumin: 4.4 g/dL (ref 3.9–4.9)
Alkaline Phosphatase: 69 [IU]/L (ref 44–121)
BUN/Creatinine Ratio: 12 (ref 9–23)
BUN: 9 mg/dL (ref 6–24)
Bilirubin Total: 0.3 mg/dL (ref 0.0–1.2)
CO2: 24 mmol/L (ref 20–29)
Calcium: 9.2 mg/dL (ref 8.7–10.2)
Chloride: 103 mmol/L (ref 96–106)
Creatinine, Ser: 0.78 mg/dL (ref 0.57–1.00)
Globulin, Total: 2.9 g/dL (ref 1.5–4.5)
Glucose: 82 mg/dL (ref 70–99)
Potassium: 4.1 mmol/L (ref 3.5–5.2)
Sodium: 142 mmol/L (ref 134–144)
Total Protein: 7.3 g/dL (ref 6.0–8.5)
eGFR: 95 mL/min/{1.73_m2} (ref >59)

## 2023-06-29 LAB — LIPID PANEL WITH LDL/HDL RATIO
Cholesterol, Total: 169 mg/dL (ref 100–199)
HDL: 59 mg/dL (ref >39)
LDL Chol Calc (NIH): 102 mg/dL — ABNORMAL HIGH (ref 0–99)
LDL/HDL Ratio: 1.7 {ratio} (ref 0.0–3.2)
Triglycerides: 38 mg/dL (ref 0–149)
VLDL Cholesterol Cal: 8 mg/dL (ref 5–40)

## 2023-06-29 NOTE — Progress Notes (Signed)
Hi Kayla Munoz, LDL cholesterol just a little borderline but otherwise cholesterol panel looks good.  Metabolic panel overall looks good.  No anemia.  The size of the red blood cells is just a little bit larger so work on a call the lab and see if we can get them to add a B12 level.  Otherwise metabolic panel including liver and kidney function is normal.  Call lab and see if we can add a B12 level.

## 2023-06-29 NOTE — Progress Notes (Signed)
Hi Sunny, apologize for the misspelling of your name.  I was trying to use my dictation software and just caught the error as I was hitting the button to send it to you so I apologize.

## 2023-07-01 LAB — SPECIMEN STATUS REPORT

## 2023-07-01 LAB — VITAMIN B12: Vitamin B-12: 833 pg/mL (ref 232–1245)

## 2023-07-04 NOTE — Progress Notes (Signed)
Hi Beonca, your vitamin B12 looks fantastic.

## 2024-04-05 ENCOUNTER — Telehealth: Payer: Self-pay | Admitting: Family Medicine

## 2024-04-05 NOTE — Telephone Encounter (Signed)
 Copied from CRM [id number]. Topic: General - Billing Inquiry >> Apr 05, 2024 12:23 PM Carrielelia G wrote: Patient Kayla Munoz calling in regards to a bill (Lipid Panel)  06/01/2022   Please advise

## 2024-04-10 NOTE — Telephone Encounter (Signed)
 Patient has $0.00 balance for DOS 06/01/2022. [name] may be from [facility] or [facility], patient would need to contact customer service number listed on bill for further assistance.

## 2024-05-24 ENCOUNTER — Other Ambulatory Visit: Payer: Self-pay | Admitting: Family Medicine

## 2024-05-24 DIAGNOSIS — Z1231 Encounter for screening mammogram for malignant neoplasm of breast: Secondary | ICD-10-CM

## 2024-06-21 ENCOUNTER — Ambulatory Visit
Admission: RE | Admit: 2024-06-21 | Discharge: 2024-06-21 | Disposition: A | Discharge status: Home / Self Care | Payer: Self-pay | Source: Ambulatory Visit | Attending: Family Medicine | Admitting: Family Medicine

## 2024-06-21 DIAGNOSIS — Z1231 Encounter for screening mammogram for malignant neoplasm of breast: Secondary | ICD-10-CM

## 2024-06-25 ENCOUNTER — Ambulatory Visit: Payer: Self-pay | Admitting: Family Medicine

## 2024-06-25 NOTE — Progress Notes (Signed)
 Please call patient. Normal mammogram.  Repeat in 1 year.

## 2024-06-27 ENCOUNTER — Ambulatory Visit (INDEPENDENT_AMBULATORY_CARE_PROVIDER_SITE_OTHER): Admitting: Family Medicine

## 2024-06-27 VITALS — BP 138/87 | Temp 98.7°F | Ht 64.0 in | Wt 219.0 lb

## 2024-06-27 DIAGNOSIS — Z124 Encounter for screening for malignant neoplasm of cervix: Secondary | ICD-10-CM

## 2024-06-27 DIAGNOSIS — Z0000 Encounter for general adult medical examination without abnormal findings: Secondary | ICD-10-CM

## 2024-06-27 DIAGNOSIS — Z1231 Encounter for screening mammogram for malignant neoplasm of breast: Secondary | ICD-10-CM

## 2024-06-27 DIAGNOSIS — Z23 Encounter for immunization: Secondary | ICD-10-CM

## 2024-06-27 DIAGNOSIS — Z1211 Encounter for screening for malignant neoplasm of colon: Secondary | ICD-10-CM

## 2024-06-27 NOTE — Progress Notes (Signed)
 Complete physical exam  Patient: Kayla Munoz   DOB: 1976/06/10   48 y.o. Female  MRN: [id number]  Subjective:    Chief Complaint  Patient presents with   Annual Exam    Tdap today Colonoscopy referral  Talk about covid shot fasting    Kayla Munoz is a 48 y.o. female who presents today for a complete physical exam. She reports consuming a general diet. Stays active. She generally feels well. She reports sleeping fairly well. She does not have additional problems to discuss today.    Most recent fall risk assessment:    06/27/2024    8:48 AM  Fall Risk   Falls in the past year? 1  Number falls in past yr: 0  Injury with Fall? 0     Most recent depression screenings:    06/27/2024    8:47 AM 06/23/2021   11:10 AM  PHQ 2/9 Scores  PHQ - 2 Score 0 0  PHQ- 9 Score 0          Patient Care Team: Alvan Dorothyann BIRCH, MD as PCP - Diedre Dunnings, Juliene SAUNDERS, DO as Consulting Physician (Neurology)   Outpatient Medications Prior to Visit  Medication Sig   ketoconazole  (NIZORAL ) 2 % shampoo WASH AFFECTED AREAS ON SCALP ONCE A DAY   Levonorgestrel  13.5 MG IUD by Intrauterine route.   No facility-administered medications prior to visit.    ROS        Objective:     BP 138/87 (BP Location: Left Arm, Patient Position: Sitting, Cuff Size: Large)   Temp 98.7 F (37.1 C) (Oral)   Ht 5' 4 (1.626 m)   Wt 219 lb (99.3 kg)   BMI 37.59 kg/m     Physical Exam Constitutional:      Appearance: Normal appearance.  HENT:     Head: Normocephalic and atraumatic.     Right Ear: Tympanic membrane, ear canal and external ear normal.     Left Ear: Tympanic membrane, ear canal and external ear normal.     Nose: Nose normal.     Mouth/Throat:     Pharynx: Oropharynx is clear.  Eyes:     Extraocular Movements: Extraocular movements intact.     Conjunctiva/sclera: Conjunctivae normal.     Pupils: Pupils are equal, round, and reactive to light.  Neck:     Thyroid: No  thyromegaly.  Cardiovascular:     Rate and Rhythm: Normal rate and regular rhythm.  Pulmonary:     Effort: Pulmonary effort is normal.     Breath sounds: Normal breath sounds.  Abdominal:     General: Bowel sounds are normal.     Palpations: Abdomen is soft.     Tenderness: There is no abdominal tenderness.  Musculoskeletal:        General: No swelling.     Cervical back: Neck supple.  Skin:    General: Skin is warm and dry.  Neurological:     Mental Status: She is oriented to person, place, and time.  Psychiatric:        Mood and Affect: Mood normal.        Behavior: Behavior normal.      No results found for any visits on 06/27/24.      Assessment & Plan:    Routine Health Maintenance and Physical Exam  Immunization History  Administered Date(s) Administered   Influenza Whole 06/26/2013   Influenza,inj,Quad PF,6+ Mos 06/06/2016, 06/26/2017   Janssen (J&J) SARS-COV-2 Vaccination 12/07/2019  Moderna Sars-Covid-2 Vaccination 07/27/2020   Pfizer Covid-19 Theatre manager 73yrs & up 06/13/2021   Pfizer(Comirnaty)Fall Seasonal Vaccine 12 years and older 06/27/2024   Tdap 07/29/2013, 06/27/2024    Health Maintenance  Topic Date Due   Hepatitis B Vaccines 19-59 Average Risk (1 of 3 - 19+ 3-dose series) Never done   Colonoscopy  Never done   Cervical Cancer Screening (HPV/Pap Cotest)  03/17/2024   Influenza Vaccine  12/24/2024 (Originally 04/26/2024)   Mammogram  06/21/2026   DTaP/Tdap/Td (3 - Td or Tdap) 06/27/2034   COVID-19 Vaccine  Completed   Hepatitis C Screening  Completed   HIV Screening  Completed   Pneumococcal Vaccine  Aged Out   HPV VACCINES  Aged Out   Meningococcal B Vaccine  Aged Out    Discussed health benefits of physical activity, and encouraged her to engage in regular exercise appropriate for her age and condition.  Problem List Items Addressed This Visit   None Visit Diagnoses       Wellness examination    -  Primary   Relevant  Orders   CBC   Lipid Panel With LDL/HDL Ratio   CMP14+EGFR     Encounter for screening mammogram for malignant neoplasm of breast         Screening for colon cancer       Relevant Orders   Ambulatory referral to Gastroenterology     Immunization due       Relevant Orders   Tdap vaccine greater than or equal to 7yo IM (Completed)   Pfizer Comirnaty Covid -19 Vaccine 33yrs and older (Completed)      No follow-ups on file.     Dorothyann Byars, MD

## 2024-06-28 ENCOUNTER — Encounter: Payer: BC Managed Care – PPO | Admitting: Family Medicine

## 2024-07-09 NOTE — Progress Notes (Signed)
   ANNUAL EXAM Patient name: Kayla Munoz MRN [id number]  Date of birth: 1975-10-11 Chief Complaint:   Gynecologic Exam (Pt reported IUD placed in 2018 (very traumatic experience having placed), vomited. Pt reported no current GYN complaints. )  History of Present Illness:   Kayla Munoz is a 48 y.o. G0P0000 female being seen today for a routine annual exam.   Current concerns: None Current birth control: Condoms. Has IUD - placed 04/2016.   No LMP recorded. (Menstrual status: IUD).   Last MXR: 06/25/2024, birad 1, cat b  Last Pap/Pap History:  H/O abnormal pap: yes 2010: pap normal 2011: Pap normal 2014: Pap normal/HPV neg 2016: Pap normal/HPV neg 2017: Pap normal/HPV pos 2020: Pap normal/HPV neg  Review of Systems:   Pertinent items are noted in HPI Denies any headaches, blurred vision, fatigue, shortness of breath, chest pain, abdominal pain, abnormal vaginal discharge/itching/odor/irritation, problems with periods, bowel movements, urination, or intercourse unless otherwise stated above.  Pertinent History Reviewed:  Reviewed past medical,surgical, social and family history.  Reviewed problem list, medications and allergies. Physical Assessment:   Vitals:   07/15/24 1007  BP: 134/85  Pulse: 90  Weight: 217 lb 1.9 oz (98.5 kg)  Height: 5' 4 (1.626 m)  Body mass index is 37.27 kg/m.   Physical Examination:  General appearance - well appearing, and in no distress Mental status - alert, oriented to person, place, and time Psych:  She has a normal mood and affect Skin - warm and dry, normal color, no suspicious lesions noted Chest - effort normal Heart - normal rate  Breasts - breasts appear normal, no suspicious masses, no skin or nipple changes or axillary nodes Abdomen - soft, nontender, nondistended, no masses or organomegaly Pelvic -  Performed and: VULVA: normal appearing vulva with no masses, tenderness or lesions VAGINA: normal appearing vagina with  normal color and discharge, no lesions CERVIX: normal appearing cervix without discharge or lesions, no CMT. UTERUS: Not examined ADNEXA: Not examined Extremities:  No swelling or varicosities noted  Chaperone present for exam  No results found for this or any previous visit (from the past 24 hours).  Assessment & Plan:  Kayla Munoz was seen today for gynecologic exam.  Diagnoses and all orders for this visit:  Encounter for annual routine gynecological examination  - Cervical cancer screening: Discussed guidelines. Pap with HPV done - STD Testing: not indicated - Birth Control: IUD. Due for exchange. Reviewed process very different now and would provide numbing and toradol injection. She would like this.  - Breast Health: Encouraged self breast awareness/SBE. Teaching provided. Discussed limits of clinical breast exam for detecting breast cancer. MXR is up to date: 06/25/24 - F/U 12 months and prn    No orders of the defined types were placed in this encounter.   Meds: No orders of the defined types were placed in this encounter.   Follow-up: No follow-ups on file.  Vina Solian, MD 07/15/2024 10:21 AM

## 2024-07-15 ENCOUNTER — Ambulatory Visit: Admitting: Obstetrics and Gynecology

## 2024-07-15 ENCOUNTER — Encounter: Payer: Self-pay | Admitting: Obstetrics and Gynecology

## 2024-07-15 ENCOUNTER — Other Ambulatory Visit (HOSPITAL_COMMUNITY)
Admission: RE | Admit: 2024-07-15 | Discharge: 2024-07-15 | Disposition: A | Source: Ambulatory Visit | Attending: Obstetrics and Gynecology | Admitting: Obstetrics and Gynecology

## 2024-07-15 VITALS — BP 134/85 | HR 90 | Ht 64.0 in | Wt 217.1 lb

## 2024-07-15 DIAGNOSIS — Z01419 Encounter for gynecological examination (general) (routine) without abnormal findings: Secondary | ICD-10-CM

## 2024-07-16 LAB — CYTOLOGY - PAP
Comment: NEGATIVE
Diagnosis: NEGATIVE
High risk HPV: NEGATIVE

## 2024-07-18 ENCOUNTER — Ambulatory Visit: Payer: Self-pay | Admitting: Obstetrics and Gynecology

## 2025-06-30 ENCOUNTER — Encounter: Admitting: Family Medicine
